# Patient Record
Sex: Male | Born: 1975 | Race: White | Hispanic: No | Marital: Single | Smoking: Current every day smoker
Health system: Southern US, Community
[De-identification: ages and names within clinical notes are randomized; demographics above are authoritative.]

## PROBLEM LIST (undated history)

## (undated) DIAGNOSIS — F32A Depression, unspecified: Secondary | ICD-10-CM

## (undated) DIAGNOSIS — I2510 Atherosclerotic heart disease of native coronary artery without angina pectoris: Secondary | ICD-10-CM

## (undated) DIAGNOSIS — F329 Major depressive disorder, single episode, unspecified: Secondary | ICD-10-CM

## (undated) DIAGNOSIS — R569 Unspecified convulsions: Secondary | ICD-10-CM

## (undated) DIAGNOSIS — F419 Anxiety disorder, unspecified: Secondary | ICD-10-CM

## (undated) HISTORY — PX: WISDOM TOOTH EXTRACTION: SHX21

---

## 1999-10-09 ENCOUNTER — Emergency Department (HOSPITAL_COMMUNITY): Admission: EM | Admit: 1999-10-09 | Discharge: 1999-10-09 | Payer: Self-pay | Admitting: Emergency Medicine

## 2001-05-14 ENCOUNTER — Encounter: Payer: Self-pay | Admitting: Family Medicine

## 2001-05-14 ENCOUNTER — Encounter: Admission: RE | Admit: 2001-05-14 | Discharge: 2001-05-14 | Payer: Self-pay | Admitting: Family Medicine

## 2001-08-01 ENCOUNTER — Emergency Department (HOSPITAL_COMMUNITY): Admission: EM | Admit: 2001-08-01 | Discharge: 2001-08-01 | Payer: Self-pay | Admitting: Emergency Medicine

## 2011-01-29 ENCOUNTER — Encounter: Payer: Self-pay | Admitting: Family Medicine

## 2011-01-29 ENCOUNTER — Ambulatory Visit (INDEPENDENT_AMBULATORY_CARE_PROVIDER_SITE_OTHER): Payer: BLUE CROSS/BLUE SHIELD | Admitting: Family Medicine

## 2011-01-29 DIAGNOSIS — Z720 Tobacco use: Secondary | ICD-10-CM | POA: Insufficient documentation

## 2011-01-29 DIAGNOSIS — Z8249 Family history of ischemic heart disease and other diseases of the circulatory system: Secondary | ICD-10-CM

## 2011-01-29 DIAGNOSIS — Z0000 Encounter for general adult medical examination without abnormal findings: Secondary | ICD-10-CM

## 2011-01-29 DIAGNOSIS — Z13220 Encounter for screening for lipoid disorders: Secondary | ICD-10-CM

## 2011-01-29 DIAGNOSIS — F17200 Nicotine dependence, unspecified, uncomplicated: Secondary | ICD-10-CM

## 2011-01-29 DIAGNOSIS — Z23 Encounter for immunization: Secondary | ICD-10-CM

## 2011-01-29 LAB — BASIC METABOLIC PANEL
BUN: 17 mg/dL (ref 6–23)
CO2: 30 mEq/L (ref 19–32)
Calcium: 9.3 mg/dL (ref 8.4–10.5)
Chloride: 103 mEq/L (ref 96–112)
Creatinine, Ser: 0.8 mg/dL (ref 0.4–1.5)
GFR: 113.19 mL/min (ref >60.00)
Glucose, Bld: 102 mg/dL — ABNORMAL HIGH (ref 70–99)
Potassium: 5.4 mEq/L — ABNORMAL HIGH (ref 3.5–5.1)
Sodium: 139 mEq/L (ref 135–145)

## 2011-01-29 LAB — CBC WITH DIFFERENTIAL/PLATELET
Basophils Absolute: 0.1 10*3/uL (ref 0.0–0.1)
Basophils Relative: 0.8 % (ref 0.0–3.0)
Eosinophils Absolute: 0.2 10*3/uL (ref 0.0–0.7)
Eosinophils Relative: 2.8 % (ref 0.0–5.0)
HCT: 38.4 % — ABNORMAL LOW (ref 39.0–52.0)
Hemoglobin: 13.4 g/dL (ref 13.0–17.0)
Lymphocytes Relative: 26.3 % (ref 12.0–46.0)
Lymphs Abs: 2 10*3/uL (ref 0.7–4.0)
MCHC: 34.9 g/dL (ref 30.0–36.0)
MCV: 98.8 fl (ref 78.0–100.0)
Monocytes Absolute: 0.5 10*3/uL (ref 0.1–1.0)
Monocytes Relative: 7.2 % (ref 3.0–12.0)
Neutro Abs: 4.8 10*3/uL (ref 1.4–7.7)
Neutrophils Relative %: 62.9 % (ref 43.0–77.0)
Platelets: 235 10*3/uL (ref 150.0–400.0)
RBC: 3.89 Mil/uL — ABNORMAL LOW (ref 4.22–5.81)
RDW: 13 % (ref 11.5–14.6)
WBC: 7.7 10*3/uL (ref 4.5–10.5)

## 2011-01-29 LAB — POCT URINALYSIS DIPSTICK
Bilirubin, UA: NEGATIVE
Blood, UA: NEGATIVE
Glucose, UA: NEGATIVE
Ketones, UA: NEGATIVE
Leukocytes, UA: NEGATIVE
Nitrite, UA: NEGATIVE
Protein, UA: NEGATIVE
Spec Grav, UA: 1.025
Urobilinogen, UA: 0.2
pH, UA: 6

## 2011-01-29 LAB — HEPATIC FUNCTION PANEL
ALT: 41 U/L (ref 0–53)
AST: 27 U/L (ref 0–37)
Albumin: 3.6 g/dL (ref 3.5–5.2)
Alkaline Phosphatase: 80 U/L (ref 39–117)
Bilirubin, Direct: 0.1 mg/dL (ref 0.0–0.3)
Total Bilirubin: 0.4 mg/dL (ref 0.3–1.2)
Total Protein: 6.5 g/dL (ref 6.0–8.3)

## 2011-01-29 LAB — LIPID PANEL
Cholesterol: 326 mg/dL — ABNORMAL HIGH (ref 0–200)
HDL: 65.2 mg/dL (ref >39.00)
Total CHOL/HDL Ratio: 5
Triglycerides: 226 mg/dL — ABNORMAL HIGH (ref 0.0–149.0)
VLDL: 45.2 mg/dL — ABNORMAL HIGH (ref 0.0–40.0)

## 2011-01-29 LAB — TSH: TSH: 1.2 u[IU]/mL (ref 0.35–5.50)

## 2011-01-29 LAB — HEMOGLOBIN A1C: Hgb A1c MFr Bld: 5.8 % (ref 4.6–6.5)

## 2011-01-29 MED ORDER — VARENICLINE TARTRATE 1 MG PO TABS: 1.0000 mg | ORAL_TABLET | Freq: Two times a day (BID) | ORAL | Status: AC

## 2011-01-29 NOTE — Patient Instructions (Signed)
Begin the chantix program by taking a half a tablet daily.  I will call you when I get your lab work back.  Obviously, no smoking.  Take an aspirin daily, and begin an exercise program by walking 30 minutes daily.  Follow-up in one year

## 2011-01-29 NOTE — Progress Notes (Signed)
  Subjective:    Patient ID: Andres Garza, male    DOB: 1976/09/10, 35 y.o.   MRN: [id number]  Andres Garza is a 35 year old single male, who comes in today after a 10 year absence to reestablish because he is concerned about his smoking and his father, who recently died of heart disease.  He smokes about 10 to 20 cigarettes per day.  His father had bypass surgery in his early 58s and in subsequent died this past fall at age 1, heart disease.  Other risk factors for hyperlipidemia, and smoking.  Mother in good health.  One brother in good health.  Sister recently diagnosed with hyper lipidemia.  Vaccination history unknown.  Will give tetanus booster today.    Review of Systems  Constitutional: Negative.   HENT: Negative.   Eyes: Negative.   Respiratory: Negative.   Cardiovascular: Negative.   Gastrointestinal: Negative.   Genitourinary: Negative.   Musculoskeletal: Negative.   Skin: Negative.   Neurological: Negative.   Hematological: Negative.   Psychiatric/Behavioral: Negative.        Objective:   Physical Exam  Constitutional: He is oriented to person, place, and time. He appears well-developed and well-nourished.  HENT:  Head: Normocephalic and atraumatic.  Right Ear: External ear normal.  Left Ear: External ear normal.  Nose: Nose normal.  Mouth/Throat: Oropharynx is clear and moist.  Eyes: Conjunctivae and EOM are normal. Pupils are equal, round, and reactive to light.  Neck: Normal range of motion. Neck supple. No JVD present. No tracheal deviation present. No thyromegaly present.  Cardiovascular: Normal rate, regular rhythm, normal heart sounds and intact distal pulses.  Exam reveals no gallop and no friction rub.   No murmur heard. Pulmonary/Chest: Effort normal and breath sounds normal. No stridor. No respiratory distress. He has no wheezes. He has no rales. He exhibits no tenderness.  Abdominal: Soft. Bowel sounds are normal. He exhibits no distension and no  mass. There is no tenderness. There is no rebound and no guarding.  Genitourinary: Penis normal. No penile tenderness.  Musculoskeletal: Normal range of motion. He exhibits no edema and no tenderness.  Lymphadenopathy:    He has no cervical adenopathy.  Neurological: He is alert and oriented to person, place, and time. He has normal reflexes. No cranial nerve deficit. He exhibits normal muscle tone.  Skin: Skin is warm and dry. No rash noted. No erythema. No pallor.  Psychiatric: He has a normal mood and affect. His behavior is normal. Judgment and thought content normal.          Assessment & Plan:  Healthy male.  History of tobacco abuse recommend starting the chantix program.  Family history premature coronary disease screen for diabetes and hyperlipidemia

## 2011-01-29 NOTE — Progress Notes (Signed)
Addended by: Kern Reap on: 01/29/2011 01:09 PM   Modules accepted: Orders

## 2011-01-30 DIAGNOSIS — Z0000 Encounter for general adult medical examination without abnormal findings: Secondary | ICD-10-CM | POA: Insufficient documentation

## 2011-01-30 MED ORDER — SIMVASTATIN 20 MG PO TABS: 20.0000 mg | ORAL_TABLET | Freq: Every day | ORAL | Status: DC

## 2011-01-30 NOTE — Progress Notes (Signed)
patient  Is aware and rx sent to pharmacy

## 2011-01-30 NOTE — Progress Notes (Signed)
Addended by: Kern Reap on: 01/30/2011 02:03 PM   Modules accepted: Orders

## 2017-06-17 ENCOUNTER — Encounter (INDEPENDENT_AMBULATORY_CARE_PROVIDER_SITE_OTHER): Payer: Self-pay | Admitting: Orthopaedic Surgery

## 2017-06-17 ENCOUNTER — Ambulatory Visit (INDEPENDENT_AMBULATORY_CARE_PROVIDER_SITE_OTHER): Payer: Self-pay | Admitting: Orthopaedic Surgery

## 2017-06-17 ENCOUNTER — Ambulatory Visit (INDEPENDENT_AMBULATORY_CARE_PROVIDER_SITE_OTHER): Payer: Self-pay

## 2017-06-17 VITALS — BP 146/88 | HR 107 | Ht 70.0 in | Wt 240.0 lb

## 2017-06-17 DIAGNOSIS — M79622 Pain in left upper arm: Secondary | ICD-10-CM

## 2017-06-17 NOTE — Progress Notes (Signed)
Office Visit Note   Patient: Andres Garza           Date of Birth: January 09, 1976           MRN: [id number] Visit Date: 06/17/2017              Requested by: No referring provider defined for this encounter. PCP: Roderick Pee, MD   Assessment & Plan: Visit Diagnoses:  1. Left upper arm pain     Probable left shoulder long head biceps tendon acute rupture. Plan: We'll obtain an MRI of his left shoulder to evaluating for acute long head biceps tendon rupture. He does lifting some moving activities while work and states he needs to have the good arm since this is his vocation. After MRI scan.  Follow-Up Instructions: No Follow-up on file.   Orders:  Orders Placed This Encounter  Procedures  . XR Humerus Left   No orders of the defined types were placed in this encounter.     Procedures: No procedures performed   Clinical Data: No additional findings.   Subjective: Chief Complaint  Patient presents with  . Left Upper Arm - Pain    HPI 41 year old male done here visiting he's here part of the time and also in [place]. He was moving a ottoman doing some pulling and suddenly felt sharp pain and pop in her left shoulder with the prominence and distal migration of the biceps. He states he had little problems with his opposite right shoulder in the past and had been doing a little bit more pulling with the left in the last few months. He denies any past injury to his left shoulder prior to this. He said the ecchymosis that occurred since his injury on 05/29/2017 with distal prominence of the biceps muscle and ecchymosis down toward the antecubital space on the left.  Review of Systems patient has positive smoking history is also has high cholesterol occasionally uses some ibuprofen for aches and pains. No previous surgeries or serious hospitalizations.   Objective: Vital Signs: BP (!) 146/88   Pulse (!) 107   Ht 5\' 10"  (1.778 m)   Wt 240 lb (108.9 kg)    BMI 34.44 kg/m   Physical Exam  Constitutional: He is oriented to person, place, and time. He appears well-developed and well-nourished.  HENT:  Head: Normocephalic and atraumatic.  Eyes: Pupils are equal, round, and reactive to light. EOM are normal.  Neck: No tracheal deviation present. No thyromegaly present.  Cardiovascular: Normal rate.   Pulmonary/Chest: Effort normal. He has no wheezes.  Abdominal: Soft. Bowel sounds are normal.  Musculoskeletal:  Patient is full cervical range of motion no brachioplexus tenderness. Palpation of the bicipital groove proximally no palpable tendon is noted. Deltoid sensation axillary nerve is intact. There is ecchymosis anteriorly over the arm that extends down the antecubital space. With some biceps resistance there is prominence of the distal bicep muscle which is asymmetrical with his opposite right arm. Asian hand is intact. Normal sensation good grip strength normal finger extension.   Neurological: He is alert and oriented to person, place, and time.  Skin: Skin is warm and dry. Capillary refill takes less than 2 seconds.  Psychiatric: He has a normal mood and affect. His behavior is normal. Judgment and thought content normal.    Ortho Exam  Specialty Comments:  No specialty comments available.  Imaging: Xr Humerus Left  Result Date: 06/17/2017 Two-view x-rays left humerus obtained and reviewed there is  no glenohumeral arthritis negative for acute fracture. Impression: Normal left humerus x-rays    PMFS History: Patient Active Problem List   Diagnosis Date Noted  . General medical examination 01/30/2011  . Tobacco abuse 01/29/2011  . Family history of premature coronary artery disease 01/29/2011   No past medical history on file.  Family History  Problem Relation Age of Onset  . Heart disease Father   . Hyperlipidemia Sister     No past surgical history on file. 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 Smoking status: Current Every Day Smoker    Packs/day: 1.00    Types: Cigarettes  . Smokeless tobacco: Never Used  . Alcohol use Yes  . Drug use: No  . Sexual activity: Not on file

## 2017-06-17 NOTE — Addendum Note (Signed)
Addended by: Rogers SeedsYEATTS, Caedon Bond M on: 06/17/2017 04:50 PM   Modules accepted: Orders

## 2017-06-22 DIAGNOSIS — M25511 Pain in right shoulder: Secondary | ICD-10-CM | POA: Diagnosis not present

## 2017-06-24 ENCOUNTER — Ambulatory Visit (INDEPENDENT_AMBULATORY_CARE_PROVIDER_SITE_OTHER): Payer: BLUE CROSS/BLUE SHIELD | Admitting: Orthopaedic Surgery

## 2017-06-24 ENCOUNTER — Encounter (INDEPENDENT_AMBULATORY_CARE_PROVIDER_SITE_OTHER): Payer: Self-pay | Admitting: Orthopaedic Surgery

## 2017-06-24 VITALS — BP 154/91 | HR 71 | Ht 70.0 in | Wt 240.0 lb

## 2017-06-24 DIAGNOSIS — S46212D Strain of muscle, fascia and tendon of other parts of biceps, left arm, subsequent encounter: Secondary | ICD-10-CM

## 2017-07-01 ENCOUNTER — Other Ambulatory Visit (INDEPENDENT_AMBULATORY_CARE_PROVIDER_SITE_OTHER): Payer: Self-pay | Admitting: Family

## 2017-07-01 ENCOUNTER — Encounter (HOSPITAL_COMMUNITY): Payer: Self-pay | Admitting: *Deleted

## 2017-07-02 ENCOUNTER — Encounter (HOSPITAL_COMMUNITY)
Admission: RE | Disposition: A | Discharge status: Home / Self Care | Payer: Self-pay | Source: Ambulatory Visit | Attending: Orthopaedic Surgery

## 2017-07-02 ENCOUNTER — Encounter (HOSPITAL_COMMUNITY): Payer: Self-pay

## 2017-07-02 ENCOUNTER — Ambulatory Visit (HOSPITAL_COMMUNITY): Payer: BLUE CROSS/BLUE SHIELD | Admitting: Certified Registered"

## 2017-07-02 ENCOUNTER — Ambulatory Visit (HOSPITAL_COMMUNITY)
Admission: RE | Admit: 2017-07-02 | Discharge: 2017-07-02 | Disposition: A | Discharge status: Home / Self Care | Payer: BLUE CROSS/BLUE SHIELD | Source: Ambulatory Visit | Attending: Orthopaedic Surgery | Admitting: Orthopaedic Surgery

## 2017-07-02 DIAGNOSIS — S46112A Strain of muscle, fascia and tendon of long head of biceps, left arm, initial encounter: Secondary | ICD-10-CM | POA: Insufficient documentation

## 2017-07-02 DIAGNOSIS — X500XXA Overexertion from strenuous movement or load, initial encounter: Secondary | ICD-10-CM | POA: Diagnosis not present

## 2017-07-02 DIAGNOSIS — F17210 Nicotine dependence, cigarettes, uncomplicated: Secondary | ICD-10-CM | POA: Insufficient documentation

## 2017-07-02 DIAGNOSIS — S46212A Strain of muscle, fascia and tendon of other parts of biceps, left arm, initial encounter: Secondary | ICD-10-CM | POA: Diagnosis not present

## 2017-07-02 DIAGNOSIS — G8918 Other acute postprocedural pain: Secondary | ICD-10-CM | POA: Diagnosis not present

## 2017-07-02 DIAGNOSIS — S46119A Strain of muscle, fascia and tendon of long head of biceps, unspecified arm, initial encounter: Secondary | ICD-10-CM

## 2017-07-02 DIAGNOSIS — Z8249 Family history of ischemic heart disease and other diseases of the circulatory system: Secondary | ICD-10-CM | POA: Diagnosis not present

## 2017-07-02 DIAGNOSIS — F418 Other specified anxiety disorders: Secondary | ICD-10-CM | POA: Diagnosis not present

## 2017-07-02 DIAGNOSIS — Z8349 Family history of other endocrine, nutritional and metabolic diseases: Secondary | ICD-10-CM | POA: Insufficient documentation

## 2017-07-02 HISTORY — DX: Major depressive disorder, single episode, unspecified: F32.9

## 2017-07-02 HISTORY — DX: Depression, unspecified: F32.A

## 2017-07-02 HISTORY — DX: Anxiety disorder, unspecified: F41.9

## 2017-07-02 HISTORY — PX: BICEPT TENODESIS: SHX5116

## 2017-07-02 LAB — CBC
HEMATOCRIT: 44.5 % (ref 39.0–52.0)
HEMOGLOBIN: 15.4 g/dL (ref 13.0–17.0)
MCH: 32.6 pg (ref 26.0–34.0)
MCHC: 34.6 g/dL (ref 30.0–36.0)
MCV: 94.3 fL (ref 78.0–100.0)
Platelets: 308 10*3/uL (ref 150–400)
RBC: 4.72 MIL/uL (ref 4.22–5.81)
RDW: 12.8 % (ref 11.5–15.5)
WBC: 10.3 10*3/uL (ref 4.0–10.5)

## 2017-07-02 LAB — COMPREHENSIVE METABOLIC PANEL
ALBUMIN: 4.1 g/dL (ref 3.5–5.0)
ALK PHOS: 65 U/L (ref 38–126)
ALT: 46 U/L (ref 17–63)
ANION GAP: 12 (ref 5–15)
AST: 33 U/L (ref 15–41)
BUN: 7 mg/dL (ref 6–20)
CO2: 19 mmol/L — AB (ref 22–32)
Calcium: 9.5 mg/dL (ref 8.9–10.3)
Chloride: 103 mmol/L (ref 101–111)
Creatinine, Ser: 0.75 mg/dL (ref 0.61–1.24)
GFR calc Af Amer: >60 mL/min (ref >60)
GFR calc non Af Amer: >60 mL/min (ref >60)
GLUCOSE: 112 mg/dL — AB (ref 65–99)
Potassium: 4.2 mmol/L (ref 3.5–5.1)
SODIUM: 134 mmol/L — AB (ref 135–145)
Total Bilirubin: 0.8 mg/dL (ref 0.3–1.2)
Total Protein: 7.1 g/dL (ref 6.5–8.1)

## 2017-07-02 SURGERY — TENODESIS, BICEPS
Anesthesia: General | Laterality: Left

## 2017-07-02 MED ORDER — BUPIVACAINE-EPINEPHRINE (PF) 0.5% -1:200000 IJ SOLN
INTRAMUSCULAR | Status: DC | PRN
  Administered 2017-07-02: 30 mL via PERINEURAL

## 2017-07-02 MED ORDER — OXYCODONE-ACETAMINOPHEN 5-325 MG PO TABS: 1.0000 | ORAL_TABLET | Freq: Four times a day (QID) | ORAL | 0 refills | Status: DC | PRN

## 2017-07-02 MED ORDER — FENTANYL CITRATE (PF) 250 MCG/5ML IJ SOLN
INTRAMUSCULAR | Status: AC
  Filled 2017-07-02: qty 5

## 2017-07-02 MED ORDER — ONDANSETRON HCL 4 MG/2ML IJ SOLN
INTRAMUSCULAR | Status: DC | PRN
  Administered 2017-07-02: 4 mg via INTRAVENOUS

## 2017-07-02 MED ORDER — PROPOFOL 10 MG/ML IV BOLUS
INTRAVENOUS | Status: DC | PRN
  Administered 2017-07-02 (×2): 200 mg via INTRAVENOUS

## 2017-07-02 MED ORDER — MIDAZOLAM HCL 2 MG/2ML IJ SOLN
2.0000 mg | Freq: Once | INTRAMUSCULAR | Status: AC
  Administered 2017-07-02: 2 mg via INTRAVENOUS
  Filled 2017-07-02: qty 2

## 2017-07-02 MED ORDER — DEXAMETHASONE SODIUM PHOSPHATE 10 MG/ML IJ SOLN
INTRAMUSCULAR | Status: DC | PRN
  Administered 2017-07-02: 5 mg via INTRAVENOUS

## 2017-07-02 MED ORDER — LIDOCAINE 2% (20 MG/ML) 5 ML SYRINGE
INTRAMUSCULAR | Status: AC
  Filled 2017-07-02: qty 5

## 2017-07-02 MED ORDER — DEXAMETHASONE SODIUM PHOSPHATE 10 MG/ML IJ SOLN
INTRAMUSCULAR | Status: AC
  Filled 2017-07-02: qty 1

## 2017-07-02 MED ORDER — FENTANYL CITRATE (PF) 100 MCG/2ML IJ SOLN
INTRAMUSCULAR | Status: DC | PRN
  Administered 2017-07-02: 100 ug via INTRAVENOUS

## 2017-07-02 MED ORDER — LACTATED RINGERS IV SOLN
INTRAVENOUS | Status: DC
  Administered 2017-07-02: 11:00:00 via INTRAVENOUS

## 2017-07-02 MED ORDER — CEFAZOLIN SODIUM-DEXTROSE 2-4 GM/100ML-% IV SOLN
2.0000 g | INTRAVENOUS | Status: AC
  Administered 2017-07-02: 2 g via INTRAVENOUS
  Filled 2017-07-02: qty 100

## 2017-07-02 MED ORDER — CHLORHEXIDINE GLUCONATE 4 % EX LIQD: 60.0000 mL | Freq: Once | CUTANEOUS | Status: DC

## 2017-07-02 MED ORDER — PROPOFOL 10 MG/ML IV BOLUS
INTRAVENOUS | Status: AC
  Filled 2017-07-02: qty 20

## 2017-07-02 MED ORDER — 0.9 % SODIUM CHLORIDE (POUR BTL) OPTIME
TOPICAL | Status: DC | PRN
  Administered 2017-07-02: 1000 mL

## 2017-07-02 MED ORDER — HYDROMORPHONE HCL 1 MG/ML IJ SOLN
0.2500 mg | INTRAMUSCULAR | Status: DC | PRN
  Administered 2017-07-02: 0.5 mg via INTRAVENOUS

## 2017-07-02 MED ORDER — BUPIVACAINE-EPINEPHRINE (PF) 0.25% -1:200000 IJ SOLN
INTRAMUSCULAR | Status: AC
  Filled 2017-07-02: qty 30

## 2017-07-02 MED ORDER — LIDOCAINE 2% (20 MG/ML) 5 ML SYRINGE
INTRAMUSCULAR | Status: DC | PRN
  Administered 2017-07-02: 40 mg via INTRAVENOUS

## 2017-07-02 MED ORDER — FENTANYL CITRATE (PF) 100 MCG/2ML IJ SOLN
100.0000 ug | Freq: Once | INTRAMUSCULAR | Status: AC
  Administered 2017-07-02: 100 ug via INTRAVENOUS
  Filled 2017-07-02: qty 2

## 2017-07-02 MED ORDER — HYDROMORPHONE HCL 1 MG/ML IJ SOLN
INTRAMUSCULAR | Status: AC
  Filled 2017-07-02: qty 1

## 2017-07-02 SURGICAL SUPPLY — 39 items
APL SKNCLS STERI-STRIP NONHPOA (GAUZE/BANDAGES/DRESSINGS) ×1
BENZOIN TINCTURE PRP APPL 2/3 (GAUZE/BANDAGES/DRESSINGS) ×2 IMPLANT
BNDG COHESIVE 6X5 TAN STRL LF (GAUZE/BANDAGES/DRESSINGS) ×2 IMPLANT
CLOSURE WOUND 1/2 X4 (GAUZE/BANDAGES/DRESSINGS) ×1
COVER SURGICAL LIGHT HANDLE (MISCELLANEOUS) ×3 IMPLANT
DRAPE INCISE IOBAN 66X45 STRL (DRAPES) ×2 IMPLANT
DRAPE U-SHAPE 47X51 STRL (DRAPES) ×3 IMPLANT
ELECT CAUTERY BLADE 6.4 (BLADE) ×2 IMPLANT
ELECT REM PT RETURN 9FT ADLT (ELECTROSURGICAL) ×3
ELECTRODE REM PT RTRN 9FT ADLT (ELECTROSURGICAL) ×1 IMPLANT
GAUZE SPONGE 4X4 12PLY STRL (GAUZE/BANDAGES/DRESSINGS) ×2 IMPLANT
GAUZE XEROFORM 1X8 LF (GAUZE/BANDAGES/DRESSINGS) ×2 IMPLANT
GLOVE BIOGEL PI IND STRL 8 (GLOVE) ×1 IMPLANT
GLOVE BIOGEL PI INDICATOR 8 (GLOVE) ×2
GLOVE ORTHO TXT STRL SZ7.5 (GLOVE) ×3 IMPLANT
GLOVE SURG SS PI 6.0 STRL IVOR (GLOVE) ×2 IMPLANT
GOWN STRL REUS W/ TWL LRG LVL3 (GOWN DISPOSABLE) ×1 IMPLANT
GOWN STRL REUS W/ TWL XL LVL3 (GOWN DISPOSABLE) ×1 IMPLANT
GOWN STRL REUS W/TWL LRG LVL3 (GOWN DISPOSABLE) ×3
GOWN STRL REUS W/TWL XL LVL3 (GOWN DISPOSABLE) ×3
KIT BASIN OR (CUSTOM PROCEDURE TRAY) ×3 IMPLANT
KIT BIO-TENODESIS 3X8 DISP (MISCELLANEOUS) ×3
KIT INSRT BABSR STRL DISP BTN (MISCELLANEOUS) IMPLANT
KIT ROOM TURNOVER OR (KITS) ×3 IMPLANT
NS IRRIG 1000ML POUR BTL (IV SOLUTION) ×3 IMPLANT
PACK ORTHO EXTREMITY (CUSTOM PROCEDURE TRAY) ×3 IMPLANT
PAD ARMBOARD 7.5X6 YLW CONV (MISCELLANEOUS) ×6 IMPLANT
STOCKINETTE IMPERVIOUS 9X36 MD (GAUZE/BANDAGES/DRESSINGS) ×2 IMPLANT
STRIP CLOSURE SKIN 1/2X4 (GAUZE/BANDAGES/DRESSINGS) ×1 IMPLANT
SUT VIC AB 2-0 CT1 27 (SUTURE) ×3
SUT VIC AB 2-0 CT1 TAPERPNT 27 (SUTURE) IMPLANT
SUT VIC AB 3-0 FS2 27 (SUTURE) ×3 IMPLANT
SUT VIC AB 4-0 PS2 27 (SUTURE) ×2 IMPLANT
TAPE CLOTH SURG 4X10 WHT LF (GAUZE/BANDAGES/DRESSINGS) ×2 IMPLANT
TOWEL OR 17X24 6PK STRL BLUE (TOWEL DISPOSABLE) ×6 IMPLANT
TUBE CONNECTING 12'X1/4 (SUCTIONS) ×1
TUBE CONNECTING 12X1/4 (SUCTIONS) ×2 IMPLANT
UNDERPAD 30X30 (UNDERPADS AND DIAPERS) ×3 IMPLANT
YANKAUER SUCT BULB TIP NO VENT (SUCTIONS) ×3 IMPLANT

## 2017-07-02 NOTE — Anesthesia Preprocedure Evaluation (Addendum)
Anesthesia Evaluation  Patient identified by MRN, date of birth, ID band Patient awake    Reviewed: Allergy & Precautions, H&P , NPO status , Patient's Chart, lab work & pertinent test results  Airway Mallampati: III  TM Distance: >3 FB Neck ROM: Full    Dental no notable dental hx. (+) Teeth Intact, Dental Advisory Given   Pulmonary Current Smoker,    Pulmonary exam normal breath sounds clear to auscultation       Cardiovascular negative cardio ROS   Rhythm:Regular Rate:Normal     Neuro/Psych Anxiety Depression negative neurological ROS     GI/Hepatic negative GI ROS, Neg liver ROS,   Endo/Other  negative endocrine ROS  Renal/GU negative Renal ROS  negative genitourinary   Musculoskeletal   Abdominal   Peds  Hematology negative hematology ROS (+)   Anesthesia Other Findings   Reproductive/Obstetrics negative OB ROS                            Anesthesia Physical Anesthesia Plan  ASA: II  Anesthesia Plan: General   Post-op Pain Management:  Regional for Post-op pain   Induction: Intravenous  PONV Risk Score and Plan: 1 and Ondansetron, Dexamethasone and Midazolam  Airway Management Planned: Oral ETT and LMA  Additional Equipment: None  Intra-op Plan:   Post-operative Plan: Extubation in OR  Informed Consent: I have reviewed the patients History and Physical, chart, labs and discussed the procedure including the risks, benefits and alternatives for the proposed anesthesia with the patient or authorized representative who has indicated his/her understanding and acceptance.   Dental advisory given  Plan Discussed with: CRNA, Anesthesiologist and Surgeon  Anesthesia Plan Comments:       Anesthesia Quick Evaluation

## 2017-07-02 NOTE — Anesthesia Procedure Notes (Signed)
Procedure Name: LMA Insertion Date/Time: 07/02/2017 1:09 PM Performed by: Charm Barges, Naoma Boxell R Pre-anesthesia Checklist: Patient identified, Emergency Drugs available, Suction available and Patient being monitored Patient Re-evaluated:Patient Re-evaluated prior to induction Oxygen Delivery Method: Circle System Utilized Preoxygenation: Pre-oxygenation with 100% oxygen Induction Type: IV induction Ventilation: Mask ventilation without difficulty LMA: LMA inserted LMA Size: 5.0 Number of attempts: 1 Placement Confirmation: positive ETCO2 Tube secured with: Tape Dental Injury: Teeth and Oropharynx as per pre-operative assessment

## 2017-07-02 NOTE — Brief Op Note (Signed)
07/02/2017  2:43 PM  PATIENT:  Jehad A Agnes  41 y.o. 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   SURGEON:  Surgeon(s) and Role:    * Eldred Manges, MD - Primary  PHYSICIAN ASSISTANT:   ASSISTANTS: none   ANESTHESIA:   general  EBL:  Total I/O In: 500 [I.V.:500] Out: 30 [Blood:30]  BLOOD ADMINISTERED:none  DRAINS: none   LOCAL MEDICATIONS USED:  NONE  SPECIMEN:  No Specimen  DISPOSITION OF SPECIMEN:  N/A  COUNTS:  YES  TOURNIQUET:  * No tourniquets in log *  DICTATION: .Other Dictation: Dictation Number [id number]  PLAN OF CARE: Discharge to home after PACU  PATIENT DISPOSITION:  PACU - hemodynamically stable.   Delay start of Pharmacological VTE agent (>24hrs) due to surgical blood loss or risk of bleeding: not applicable

## 2017-07-02 NOTE — Anesthesia Postprocedure Evaluation (Signed)
Anesthesia Post Note  Patient: Andres Garza  Procedure(s) Performed: Procedure(s) (LRB): Left Arm Biceps Tenodesis (Left)     Patient location during evaluation: PACU Anesthesia Type: General and Regional Level of consciousness: awake and alert Pain management: pain level controlled Vital Signs Assessment: post-procedure vital signs reviewed and stable Respiratory status: spontaneous breathing, nonlabored ventilation and respiratory function stable Cardiovascular status: blood pressure returned to baseline and stable Postop Assessment: no apparent nausea or vomiting Anesthetic complications: no    Last Vitals:  Vitals:   07/02/17 1515 07/02/17 1530  BP:  (!) 135/92  Pulse: 82 74  Resp: 18 15  Temp:  36.6 C  SpO2: 96% 95%    Last Pain:  Vitals:   07/02/17 1457  TempSrc:   PainSc: 6                  Afia Messenger,W. EDMOND

## 2017-07-02 NOTE — Transfer of Care (Signed)
Immediate Anesthesia Transfer of Care Note  Patient: Estevon A Leard  Procedure(s) Performed: Procedure(s): Left Arm Biceps Tenodesis (Left)  Patient Location: PACU  Anesthesia Type:GA combined with regional for post-op pain  Level of Consciousness: awake, oriented and patient cooperative  Airway & Oxygen Therapy: Patient Spontanous Breathing and Patient connected to nasal cannula oxygen  Post-op Assessment: Report given to RN, Post -op Vital signs reviewed and stable and Patient moving all extremities  Post vital signs: Reviewed and stable  Last Vitals:  Vitals:   07/02/17 1026  BP: (!) 145/97  Pulse: 77  Resp: 18  Temp: 36.8 C  SpO2: 98%    Last Pain:  Vitals:   07/02/17 1026  TempSrc: Oral      Patients Stated Pain Goal: 5 (07/02/17 1030)  Complications: No apparent anesthesia complications

## 2017-07-02 NOTE — Progress Notes (Signed)
Orthopedic Tech Progress Note Patient Details:  Andres Garza 04-03-1976 [id number]  Ortho Devices Type of Ortho Device: Arm sling Ortho Device/Splint Location: lue Ortho Device/Splint Interventions: Application   Lori Popowski 07/02/2017, 3:10 PM

## 2017-07-02 NOTE — Anesthesia Procedure Notes (Signed)
Anesthesia Regional Block: Interscalene brachial plexus block   Pre-Anesthetic Checklist: ,, timeout performed, Correct Patient, Correct Site, Correct Laterality, Correct Procedure, Correct Position, site marked, Risks and benefits discussed, pre-op evaluation,  At surgeon's request and post-op pain management  Laterality: Left  Prep: Maximum Sterile Barrier Precautions used, chloraprep       Needles:  Injection technique: Single-shot  Needle Type: Echogenic Stimulator Needle     Needle Length: 5cm  Needle Gauge: 22     Additional Needles:   Procedures:, nerve stimulator,,, ultrasound used (permanent image in chart),,,,   Nerve Stimulator or Paresthesia:  Response: Biceps response,   Additional Responses:   Narrative:  Start time: 07/02/2017 11:07 AM End time: 07/02/2017 11:17 AM Injection made incrementally with aspirations every 5 mL. Anesthesiologist: Gaynelle Adu  Additional Notes: 2% Lidocaine skin wheel.

## 2017-07-02 NOTE — Discharge Instructions (Signed)
Ok to shower, sling PRN. Remove dressing at 48hrs and OK to apply large band aids.  See Dr. Ophelia Charter in one week

## 2017-07-02 NOTE — H&P (Signed)
Andres Garza is an 41 y.o. male.   Chief Complaint: left biceps long head rupture HPI: moving ottoman with sharp left shoulder pain and pop. MRI biceps tendon rupture at shoulder.  Past Medical History:  Diagnosis Date  . Anxiety   . Depression    situational    Past Surgical History:  Procedure Laterality Date  . WISDOM TOOTH EXTRACTION  1990s   Family History  Problem Relation Age of Onset  . Heart disease Father   . Hyperlipidemia Sister    Social History:  reports that he has been smoking Cigarettes.  He has a 10.00 pack-year smoking history. He has never used smokeless tobacco. He reports that he drinks about 21.6 oz of alcohol per week . He reports that he uses drugs, including Marijuana.  Allergies: No Known Allergies  Medications Prior to Admission  Medication Sig Dispense Refill  . ibuprofen (ADVIL,MOTRIN) 200 MG tablet Take 600-800 mg by mouth every 8 (eight) hours as needed (for pain/inflammation.).      Results for orders placed or performed during the hospital encounter of 07/02/17 (from the past 48 hour(s))  Comprehensive metabolic panel     Status: Abnormal   Collection Time: 07/02/17 10:22 AM  Result Value Ref Range   Sodium 134 (L) 135 - 145 mmol/L   Potassium 4.2 3.5 - 5.1 mmol/L   Chloride 103 101 - 111 mmol/L   CO2 19 (L) 22 - 32 mmol/L   Glucose, Bld 112 (H) 65 - 99 mg/dL   BUN 7 6 - 20 mg/dL   Creatinine, Ser 0.75 0.61 - 1.24 mg/dL   Calcium 9.5 8.9 - 10.3 mg/dL   Total Protein 7.1 6.5 - 8.1 g/dL   Albumin 4.1 3.5 - 5.0 g/dL   AST 33 15 - 41 U/L   ALT 46 17 - 63 U/L   Alkaline Phosphatase 65 38 - 126 U/L   Total Bilirubin 0.8 0.3 - 1.2 mg/dL   GFR calc non Af Amer >60 >60 mL/min   GFR calc Af Amer >60 >60 mL/min    Comment: (NOTE) The eGFR has been calculated using the CKD EPI equation. This calculation has not been validated in all clinical situations. eGFR's persistently <60 mL/min signify possible Chronic Kidney Disease.    Anion gap  12 5 - 15  CBC     Status: None   Collection Time: 07/02/17 10:22 AM  Result Value Ref Range   WBC 10.3 4.0 - 10.5 K/uL   RBC 4.72 4.22 - 5.81 MIL/uL   Hemoglobin 15.4 13.0 - 17.0 g/dL   HCT 44.5 39.0 - 52.0 %   MCV 94.3 78.0 - 100.0 fL   MCH 32.6 26.0 - 34.0 pg   MCHC 34.6 30.0 - 36.0 g/dL   RDW 12.8 11.5 - 15.5 %   Platelets 308 150 - 400 K/uL   No results found.  Review of Systems  Constitutional: Negative.   HENT: Negative.   Eyes: Negative.   Cardiovascular: Negative.   Gastrointestinal: Negative.   Genitourinary: Negative.   Musculoskeletal:       Left shoulder pain  Skin: Negative.   Endo/Heme/Allergies: Negative.   Psychiatric/Behavioral: Negative.     Blood pressure (!) 145/97, pulse 77, temperature 98.3 F (36.8 C), temperature source Oral, resp. rate 18, height '5\' 10"'$  (1.778 m), weight 240 lb (108.9 kg), SpO2 98 %. Physical Exam  Constitutional: He is oriented to person, place, and time. He appears well-developed and well-nourished.  HENT:  Head:  Normocephalic.  Eyes: Pupils are equal, round, and reactive to light.  Neck: Normal range of motion.  Cardiovascular: Normal rate.   Respiratory: Effort normal.  GI: Soft. Bowel sounds are normal.  Musculoskeletal:  Left biceps muscle distal migration. Tender bicipital groove  Neurological: He is alert and oriented to person, place, and time.  Skin: Skin is warm and dry.  Psychiatric: He has a normal mood and affect. His behavior is normal.     Assessment/Plan Plan biceps tenodesis for long head shoulder rupture. Risks discussed he understands and agrees to proceed.   Marybelle Killings, MD 07/02/2017, 12:54 PM

## 2017-07-02 NOTE — Interval H&P Note (Signed)
History and Physical Interval Note:  07/02/2017 12:59 PM  Andres Garza  has presented today for surgery, with the diagnosis of Left Shoulder Acute Long Head Biceps Rupture  The various methods of treatment have been discussed with the patient and family. After consideration of risks, benefits and other options for treatment, the patient has consented to  Procedure(s): Left Arm Biceps Tenodesis (Left) as a surgical intervention .  The patient's history has been reviewed, patient examined, no change in status, stable for surgery.  I have reviewed the patient's chart and labs.  Questions were answered to the patient's satisfaction.     Eldred Manges

## 2017-07-03 ENCOUNTER — Encounter (HOSPITAL_COMMUNITY): Payer: Self-pay | Admitting: Orthopaedic Surgery

## 2017-07-03 NOTE — Op Note (Addendum)
There was a delay in dictating this note due to dragon dictation going down from an update on [employer].  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  Procedure: exploration for repair of long head biceps tendon. ( Operative findings showed that this tear was old from many years ago and not able to be repaired.) resection of portion of long head chronic biceps rupture.   Surgeon: Annell Greening M.D.  Anesthesia: Gen.  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 MRI scan was done of his shoulder preoperatively that showed long head of the biceps tendon was absent in the bicipital groove and there was no intra-articular pathology. He was brought to surgery for expected acute repair of long head biceps tendon tear.  Procedure after standard prepping and draping preoperative antibiotics timeout procedure with extremity sheets and drapes impervious stockinette prepping of the shoulder all the way down to the wrist. DuraPrep was used Betadine Steri-Drape was applied. Incision was made starting at the bicipital groove extending down 4 cm. Deltoid was split down the bicipital groove and the bicipital groove was visualized with absent long head biceps tendon. Attempts to milk tendon from distal proximal was performed unsuccessfully. Extended incision over the biceps muscle halfway down to the elbow was performed and extensive scarring was seen with absent long head of the biceps tendon which should rolled into a mass of scar tissue and was not able to be dissected out of the massive scar tissue so  it could be extended. Portion was cut longitudinally and opened up. There was scar tissue mixed with some tendon fibers, a portion was resected  the  of the long tendon of the biceps but there was not fibers that could be separated to extend proximally to even a subpectoralis position for attempted tenodesis.  Short head of the biceps was visualized and showed some mild tendinopathy changes. Extensive work was done on the ball of tissue hoping to be able to uncoil  the biceps tendon to stretch it up to place it in a subpectoralis position for tenodesis  but there was no stump of tendon that could be extended from the ball of scar tissue . Since patient had been doing heavy lifting for several years without problems until 05/29/2017 it appeared that he had strained the biceps muscle at the time of his injury on 05/29/2017 and in the interim from his original injury likely several years ago he been able to continue working and lifting. He had not been permitted for grafting and since in the last 3 years has been able to do heavy lifting without problems no tendon graft was performed. Wound was copiously irrigated. Split the deltoid was loosely approximated with 2-0 Vicryl which fell back and evenly into place. Subtendinous tissue was reapproximated with 2-0 Vicryl 3-0 Vicryl and more superficial layer and a running 4-0 Vicryl subcuticular closure tincture benzoin Steri-Strips postop dressing and placement of the sling was performed. Patient tolerated procedure well and operative findings was discussed with the patient as well as his family in recovery room. Patient tolerated the procedure well and was transferred to  the recovery room in stable condition.

## 2017-07-04 ENCOUNTER — Encounter: Payer: Self-pay | Admitting: Family Medicine

## 2017-07-07 NOTE — Progress Notes (Signed)
Office Visit Note   Patient: Andres Garza           Date of Birth: 1976/03/22           MRN: [id number] Visit Date: 06/24/2017              Requested by: Roderick Pee, MD [address] [place], [place] [address] PCP: Roderick Pee, MD   Assessment & Plan: Visit Diagnoses:  1. Rupture of left proximal biceps tendon, subsequent encounter     Plan: Patient like to proceed with long head biceps tendon T no decelerations we discussed options for tenodesis versus no repair. We discussed it is possible that he may have had some partial tearing were longer to time in the pop may have been just a tiny residual stump. He experienced acute pain and significant ecchymosis at the time of his injury. It's now been almost a month since his injury. Plan exploration and tenodesis.Marland Kitchen MRI scan of the shoulder did not reveal any intra-articular pathology and I do not think shoulder arthroscopy needs to be included. Procedure discussed questions were elicited and answered he understands request to proceed. We discussed that the tear is old and it may not be able to be tenodesed.  Follow-Up Instructions: No Follow-up on file.   Orders:  No orders of the defined types were placed in this encounter.  No orders of the defined types were placed in this encounter.     Procedures: No procedures performed   Clinical Data: No additional findings.   Subjective: Chief Complaint  Patient presents with  . Left Shoulder - Follow-up    HPI 41 year old male returns for acute left biceps injury that occurred on 05/29/2017 when he was lifting an ottoman which was heavy bowling and felt sharp pop with pain drop the ottoman and had ecchymosis with prominence of the distal biceps and the arm which is a new finding for him. He's had an MRI scan of the shoulder for review. 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  Patient continues to have pain if he tries to pull his been resting the arm. MRI scan has been completed.  Review of Systems review of systems is updated unchanged from 06/17/2017 office visit. He denies any previous prominence of the distal biceps in the left arm until the 05/29/2017 injury. Had no pain with pulling prior to this.   Objective: Vital Signs: BP (!) 154/91   Pulse 71   Ht  (1.778 m)   Wt 240 lb (108.9 kg)   BMI 34.44 kg/m   Physical Exam  Constitutional: He is oriented to person, place, and time. He appears well-developed and well-nourished.  HENT:  Head: Normocephalic and atraumatic.  Eyes: Pupils are equal, round, and reactive to light. EOM are normal.  Neck: No tracheal deviation present. No thyromegaly present.  Cardiovascular: Normal rate.   Pulmonary/Chest: Effort normal. He has no wheezes.  Abdominal: Soft. Bowel sounds are normal.  Neurological: He is alert and oriented to person, place, and time.  Skin: Skin is warm and dry. Capillary refill takes less than 2 seconds.  Psychiatric: He has a normal mood and affect. His behavior is normal. Judgment and thought content normal.    Ortho Exam patient said some decrease on since last week the distal arm ecchymosis as changed in color in the distal arm and barely visible. He still is prominence  of the distal biceps and bicipital groove palpation does not reveal palpable tendon. He does have tenderness in this region. Sensation the hand is intact. Good cervical range of motion no brachial plexus tenderness. No evidence of peripheral nerve entrapment left right upper extremity. Opposite right shoulder shows palpable biceps tendon in the bicipital groove. No muscle atrophy. Distal biceps tendon is palpable. Prominence of the biceps muscle in the distal arm and pain with resisted elbow flexion.  Specialty Comments:  No specialty comments available.  Imaging: 06/22/2017 MRI scan left shoulder is reviewed. This shows an  head of the biceps tendon is not visualized in the bicipital groove with a small stump the biceps tendon attached to the superior labrum. Rotator cuff was intact no glenohumeral arthritis. No labral tears.   PMFS History: Patient Active Problem List   Diagnosis Date Noted  . Biceps rupture, proximal 07/02/2017  . General medical examination 01/30/2011  . Tobacco abuse 01/29/2011  . Family history of premature coronary artery disease 01/29/2011   Past Medical History:  Diagnosis Date  . Anxiety   . Depression    situational    Family History  Problem Relation Age of Onset  . Heart disease Father   . Hyperlipidemia Sister     Past Surgical History:  Procedure Laterality Date  . BICEPT TENODESIS Left 07/02/2017   Procedure: Left Arm Biceps Tenodesis;  Surgeon: Eldred Manges, MD;  Location: [facility] OR;  Service: Orthopedics;  Laterality: Left;  . 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 Smoking status: Current Every Day Smoker    Packs/day: 1.00    Years: 10.00    Types: Cigarettes  . Smokeless tobacco: Never Used     Comment: stop and started "probably 10 years total over 20 years"   . Alcohol use 21.6 oz/week    36 Cans of beer per week  . Drug use: Yes    Types: Marijuana  . Sexual activity: Not on file

## 2017-07-09 ENCOUNTER — Ambulatory Visit (INDEPENDENT_AMBULATORY_CARE_PROVIDER_SITE_OTHER): Payer: BLUE CROSS/BLUE SHIELD | Admitting: Surgery

## 2017-07-09 ENCOUNTER — Encounter (INDEPENDENT_AMBULATORY_CARE_PROVIDER_SITE_OTHER): Payer: Self-pay | Admitting: Surgery

## 2017-07-09 ENCOUNTER — Ambulatory Visit (INDEPENDENT_AMBULATORY_CARE_PROVIDER_SITE_OTHER): Payer: Self-pay | Admitting: Orthopaedic Surgery

## 2017-07-09 DIAGNOSIS — S46212D Strain of muscle, fascia and tendon of other parts of biceps, left arm, subsequent encounter: Secondary | ICD-10-CM

## 2017-07-09 NOTE — Progress Notes (Signed)
   Post-Op Visit Note   Patient: Andres Garza           Date of Birth: [birth year]           MRN: [id number] Visit Date: 07/09/2017 PCP: Roderick Pee, MD   Assessment & Plan:  Chief Complaint: No chief complaint on file. Patient returns. 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 Doing well at this point. He is hoping to return back to [place] where he lives as soon as possible. Visit Diagnoses:  1. Rupture of left proximal biceps tendon, subsequent encounter     Plan: Patient doing well. He is planning on returning back to [place] his symptoms he can. Recommend that he come back in 1 week for final wound check. All questions answered. No heavy lifting. He can work range of motion of his elbow. Follow-Up Instructions: Return in about 1 week (around 07/16/2017) for Wound check.   Orders:  No orders of the defined types were placed in this encounter.  No orders of the defined types were placed in this encounter.   Imaging: No results found.  PMFS History: Patient Active Problem List   Diagnosis Date Noted  . Biceps rupture, proximal 07/02/2017  . General medical examination 01/30/2011  . Tobacco abuse 01/29/2011  . Family history of premature coronary artery disease 01/29/2011   Past Medical History:  Diagnosis Date  . Anxiety   . Depression    situational    Family History  Problem Relation Age of Onset  . Heart disease Father   . Hyperlipidemia Sister     Past Surgical History:  Procedure Laterality Date  . BICEPT TENODESIS Left 07/02/2017   Procedure: Left Arm Biceps Tenodesis;  Surgeon: Eldred Manges, MD;  Location: [facility] OR;  Service: Orthopedics;  Laterality: Left;  . 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 Smoking status: Current Every Day Smoker    Packs/day: 1.00    Years: 10.00   Types: Cigarettes  . Smokeless tobacco: Never Used     Comment: stop and started "probably 10 years total over 20 years"   . Alcohol use 21.6 oz/week    36 Cans of beer per week  . Drug use: Yes    Types: Marijuana  . Sexual activity: Not on file

## 2017-07-11 ENCOUNTER — Other Ambulatory Visit (INDEPENDENT_AMBULATORY_CARE_PROVIDER_SITE_OTHER): Payer: Self-pay | Admitting: Family

## 2017-07-11 ENCOUNTER — Telehealth (INDEPENDENT_AMBULATORY_CARE_PROVIDER_SITE_OTHER): Payer: Self-pay | Admitting: Orthopaedic Surgery

## 2017-07-11 MED ORDER — OXYCODONE-ACETAMINOPHEN 5-325 MG PO TABS: 1.0000 | ORAL_TABLET | Freq: Four times a day (QID) | ORAL | 0 refills | Status: DC | PRN

## 2017-07-11 NOTE — Telephone Encounter (Signed)
Patient called left voicemail message needing refill on pain medicine. The number to contact patient is [phone]

## 2017-07-11 NOTE — Telephone Encounter (Signed)
Can you please advise since Dr. Ophelia Charter is out of the office?  Patient had surgery on 07/02/2017 for biceps tendon rupture that could not be repaired. He was prescribed Oxycodone 5/325 1 po q 6 hrs prn pain #30 on 07/02/17.

## 2017-07-11 NOTE — Telephone Encounter (Signed)
Script at front for pick up. Patient advised.  

## 2017-07-16 ENCOUNTER — Encounter (INDEPENDENT_AMBULATORY_CARE_PROVIDER_SITE_OTHER): Payer: Self-pay | Admitting: Surgery

## 2017-07-16 ENCOUNTER — Ambulatory Visit (INDEPENDENT_AMBULATORY_CARE_PROVIDER_SITE_OTHER): Payer: BLUE CROSS/BLUE SHIELD | Admitting: Surgery

## 2017-07-16 DIAGNOSIS — S46212D Strain of muscle, fascia and tendon of other parts of biceps, left arm, subsequent encounter: Secondary | ICD-10-CM

## 2017-07-16 DIAGNOSIS — Z9889 Other specified postprocedural states: Secondary | ICD-10-CM

## 2017-07-16 MED ORDER — OXYCODONE-ACETAMINOPHEN 5-325 MG PO TABS: 1.0000 | ORAL_TABLET | Freq: Three times a day (TID) | ORAL | 0 refills | Status: AC | PRN

## 2017-07-16 NOTE — Patient Instructions (Signed)
Do shoulder strengthening exercises that were given.

## 2017-07-16 NOTE — Progress Notes (Signed)
   Post-Op Visit Note   Patient: Andres Garza           Date of Birth: July 31, 1976           MRN: [id number] Visit Date: 07/16/2017 PCP: Roderick Pee, MD   Assessment & Plan:  Chief Complaint:  Chief Complaint  Patient presents with  . Left Arm - Routine Post Op, Follow-up   Visit Diagnoses:  1. Rupture of left proximal biceps tendon, subsequent encounter   2. Status post arthroscopy of left shoulder     Plan: Shoulder strengthening exercises were given. He will follow-up with [place] when necessary since she is planning on returning back to [place] this week. States that he might be around [place] around Thanksgiving and told him if there are any problems that he can call and schedule an appointment with me. Did refill Percocet.  Follow-Up Instructions: Return if symptoms worsen or fail to improve.   Orders:  No orders of the defined types were placed in this encounter.  Meds ordered this encounter  Medications  . oxyCODONE-acetaminophen (ROXICET) 5-325 MG tablet    Sig: Take 1 tablet by mouth every 8 (eight) hours as needed.    Dispense:  40 tablet    Refill:  0    Imaging: No results found.  PMFS History: Patient Active Problem List   Diagnosis Date Noted  . Biceps rupture, proximal 07/02/2017  . General medical examination 01/30/2011  . Tobacco abuse 01/29/2011  . Family history of premature coronary artery disease 01/29/2011   Past Medical History:  Diagnosis Date  . Anxiety   . Depression    situational    Family History  Problem Relation Age of Onset  . Heart disease Father   . Hyperlipidemia Sister     Past Surgical History:  Procedure Laterality Date  . BICEPT TENODESIS Left 07/02/2017   Procedure: Left Arm Biceps Tenodesis;  Surgeon: Eldred Manges, MD;  Location: [facility] OR;  Service: Orthopedics;  Laterality: Left;  . 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 Smoking status: Current Every Day Smoker    Packs/day: 1.00    Years: 10.00    Types: Cigarettes  . Smokeless tobacco: Never Used     Comment: stop and started "probably 10 years total over 20 years"   . Alcohol use 21.6 oz/week    36 Cans of beer per week  . Drug use: Yes    Types: Marijuana  . Sexual activity: Not on file   Exam Pleasant white male alert and oriented in no acute distress. Surgical incision is well-healed. No drainage or signs of infection. Good elbow and shoulder range of motion.

## 2017-10-10 DIAGNOSIS — G40909 Epilepsy, unspecified, not intractable, without status epilepticus: Secondary | ICD-10-CM | POA: Diagnosis not present

## 2017-10-13 DIAGNOSIS — R569 Unspecified convulsions: Secondary | ICD-10-CM | POA: Diagnosis not present

## 2017-10-16 DIAGNOSIS — G40909 Epilepsy, unspecified, not intractable, without status epilepticus: Secondary | ICD-10-CM | POA: Diagnosis not present

## 2017-10-28 DIAGNOSIS — G40909 Epilepsy, unspecified, not intractable, without status epilepticus: Secondary | ICD-10-CM | POA: Diagnosis not present

## 2017-10-28 DIAGNOSIS — R05 Cough: Secondary | ICD-10-CM | POA: Diagnosis not present

## 2017-10-29 DIAGNOSIS — R05 Cough: Secondary | ICD-10-CM | POA: Diagnosis not present

## 2017-10-29 DIAGNOSIS — G40909 Epilepsy, unspecified, not intractable, without status epilepticus: Secondary | ICD-10-CM | POA: Diagnosis not present

## 2017-10-30 DIAGNOSIS — S22000A Wedge compression fracture of unspecified thoracic vertebra, initial encounter for closed fracture: Secondary | ICD-10-CM | POA: Diagnosis not present

## 2017-11-05 DIAGNOSIS — G40909 Epilepsy, unspecified, not intractable, without status epilepticus: Secondary | ICD-10-CM | POA: Diagnosis not present

## 2017-11-05 DIAGNOSIS — X58XXXA Exposure to other specified factors, initial encounter: Secondary | ICD-10-CM | POA: Diagnosis not present

## 2017-11-05 DIAGNOSIS — G40309 Generalized idiopathic epilepsy and epileptic syndromes, not intractable, without status epilepticus: Secondary | ICD-10-CM | POA: Diagnosis not present

## 2017-11-05 DIAGNOSIS — S22000A Wedge compression fracture of unspecified thoracic vertebra, initial encounter for closed fracture: Secondary | ICD-10-CM | POA: Diagnosis not present

## 2017-11-07 DIAGNOSIS — G40909 Epilepsy, unspecified, not intractable, without status epilepticus: Secondary | ICD-10-CM | POA: Diagnosis not present

## 2017-11-13 DIAGNOSIS — S22000A Wedge compression fracture of unspecified thoracic vertebra, initial encounter for closed fracture: Secondary | ICD-10-CM | POA: Diagnosis not present

## 2017-11-20 DIAGNOSIS — S22000A Wedge compression fracture of unspecified thoracic vertebra, initial encounter for closed fracture: Secondary | ICD-10-CM | POA: Diagnosis not present

## 2017-11-28 DIAGNOSIS — X58XXXA Exposure to other specified factors, initial encounter: Secondary | ICD-10-CM | POA: Diagnosis not present

## 2017-11-28 DIAGNOSIS — S22000A Wedge compression fracture of unspecified thoracic vertebra, initial encounter for closed fracture: Secondary | ICD-10-CM | POA: Diagnosis not present

## 2017-12-02 DIAGNOSIS — S22000A Wedge compression fracture of unspecified thoracic vertebra, initial encounter for closed fracture: Secondary | ICD-10-CM | POA: Diagnosis not present

## 2017-12-08 DIAGNOSIS — G40909 Epilepsy, unspecified, not intractable, without status epilepticus: Secondary | ICD-10-CM | POA: Diagnosis not present

## 2017-12-08 DIAGNOSIS — M545 Low back pain: Secondary | ICD-10-CM | POA: Diagnosis not present

## 2018-01-27 DIAGNOSIS — S22000A Wedge compression fracture of unspecified thoracic vertebra, initial encounter for closed fracture: Secondary | ICD-10-CM | POA: Diagnosis not present

## 2018-01-27 DIAGNOSIS — X58XXXA Exposure to other specified factors, initial encounter: Secondary | ICD-10-CM | POA: Diagnosis not present

## 2018-02-12 DIAGNOSIS — S229XXA Fracture of bony thorax, part unspecified, initial encounter for closed fracture: Secondary | ICD-10-CM | POA: Diagnosis not present

## 2018-04-28 DIAGNOSIS — F411 Generalized anxiety disorder: Secondary | ICD-10-CM | POA: Diagnosis not present

## 2018-05-05 DIAGNOSIS — F411 Generalized anxiety disorder: Secondary | ICD-10-CM | POA: Diagnosis not present

## 2018-05-05 DIAGNOSIS — Z0000 Encounter for general adult medical examination without abnormal findings: Secondary | ICD-10-CM | POA: Diagnosis not present

## 2018-05-07 DIAGNOSIS — E785 Hyperlipidemia, unspecified: Secondary | ICD-10-CM | POA: Diagnosis not present

## 2018-06-23 DIAGNOSIS — E7800 Pure hypercholesterolemia, unspecified: Secondary | ICD-10-CM | POA: Diagnosis not present

## 2018-06-23 DIAGNOSIS — Z8249 Family history of ischemic heart disease and other diseases of the circulatory system: Secondary | ICD-10-CM | POA: Diagnosis not present

## 2018-06-23 DIAGNOSIS — I340 Nonrheumatic mitral (valve) insufficiency: Secondary | ICD-10-CM | POA: Diagnosis not present

## 2018-06-23 DIAGNOSIS — I517 Cardiomegaly: Secondary | ICD-10-CM | POA: Diagnosis not present

## 2018-06-30 DIAGNOSIS — I517 Cardiomegaly: Secondary | ICD-10-CM | POA: Diagnosis not present

## 2018-06-30 DIAGNOSIS — E7800 Pure hypercholesterolemia, unspecified: Secondary | ICD-10-CM | POA: Diagnosis not present

## 2018-06-30 DIAGNOSIS — I340 Nonrheumatic mitral (valve) insufficiency: Secondary | ICD-10-CM | POA: Diagnosis not present

## 2018-07-17 DIAGNOSIS — G40909 Epilepsy, unspecified, not intractable, without status epilepticus: Secondary | ICD-10-CM | POA: Diagnosis not present

## 2018-11-05 DIAGNOSIS — F410 Panic disorder [episodic paroxysmal anxiety] without agoraphobia: Secondary | ICD-10-CM | POA: Diagnosis not present

## 2018-11-05 DIAGNOSIS — F4323 Adjustment disorder with mixed anxiety and depressed mood: Secondary | ICD-10-CM | POA: Diagnosis not present

## 2018-11-12 DIAGNOSIS — F410 Panic disorder [episodic paroxysmal anxiety] without agoraphobia: Secondary | ICD-10-CM | POA: Diagnosis not present

## 2018-11-12 DIAGNOSIS — F4323 Adjustment disorder with mixed anxiety and depressed mood: Secondary | ICD-10-CM | POA: Diagnosis not present

## 2018-11-19 DIAGNOSIS — F410 Panic disorder [episodic paroxysmal anxiety] without agoraphobia: Secondary | ICD-10-CM | POA: Diagnosis not present

## 2018-11-19 DIAGNOSIS — F4323 Adjustment disorder with mixed anxiety and depressed mood: Secondary | ICD-10-CM | POA: Diagnosis not present

## 2018-12-08 DIAGNOSIS — R509 Fever, unspecified: Secondary | ICD-10-CM | POA: Diagnosis not present

## 2018-12-08 DIAGNOSIS — R0781 Pleurodynia: Secondary | ICD-10-CM | POA: Diagnosis not present

## 2018-12-08 DIAGNOSIS — R5383 Other fatigue: Secondary | ICD-10-CM | POA: Diagnosis not present

## 2018-12-08 DIAGNOSIS — J069 Acute upper respiratory infection, unspecified: Secondary | ICD-10-CM | POA: Diagnosis not present

## 2018-12-10 DIAGNOSIS — F410 Panic disorder [episodic paroxysmal anxiety] without agoraphobia: Secondary | ICD-10-CM | POA: Diagnosis not present

## 2018-12-15 DIAGNOSIS — F411 Generalized anxiety disorder: Secondary | ICD-10-CM | POA: Diagnosis not present

## 2018-12-21 ENCOUNTER — Other Ambulatory Visit: Payer: Self-pay

## 2018-12-21 ENCOUNTER — Encounter (HOSPITAL_COMMUNITY): Payer: Self-pay | Admitting: *Deleted

## 2018-12-21 ENCOUNTER — Emergency Department (HOSPITAL_COMMUNITY)
Admission: EM | Admit: 2018-12-21 | Discharge: 2018-12-22 | Disposition: A | Discharge status: Home / Self Care | Payer: BLUE CROSS/BLUE SHIELD | Attending: Emergency Medicine | Admitting: Emergency Medicine

## 2018-12-21 DIAGNOSIS — F17210 Nicotine dependence, cigarettes, uncomplicated: Secondary | ICD-10-CM | POA: Insufficient documentation

## 2018-12-21 DIAGNOSIS — R1011 Right upper quadrant pain: Secondary | ICD-10-CM | POA: Diagnosis not present

## 2018-12-21 DIAGNOSIS — F1290 Cannabis use, unspecified, uncomplicated: Secondary | ICD-10-CM | POA: Diagnosis not present

## 2018-12-21 DIAGNOSIS — R1010 Upper abdominal pain, unspecified: Secondary | ICD-10-CM

## 2018-12-21 DIAGNOSIS — R634 Abnormal weight loss: Secondary | ICD-10-CM | POA: Diagnosis not present

## 2018-12-21 DIAGNOSIS — R1013 Epigastric pain: Secondary | ICD-10-CM | POA: Diagnosis not present

## 2018-12-21 DIAGNOSIS — R000 Tachycardia, unspecified: Secondary | ICD-10-CM | POA: Diagnosis not present

## 2018-12-21 DIAGNOSIS — I2510 Atherosclerotic heart disease of native coronary artery without angina pectoris: Secondary | ICD-10-CM | POA: Diagnosis not present

## 2018-12-21 HISTORY — DX: Atherosclerotic heart disease of native coronary artery without angina pectoris: I25.10

## 2018-12-21 HISTORY — DX: Unspecified convulsions: R56.9

## 2018-12-21 LAB — CBC
HEMATOCRIT: 44.4 % (ref 39.0–52.0)
Hemoglobin: 14.9 g/dL (ref 13.0–17.0)
MCH: 30.7 pg (ref 26.0–34.0)
MCHC: 33.6 g/dL (ref 30.0–36.0)
MCV: 91.5 fL (ref 80.0–100.0)
Platelets: 353 10*3/uL (ref 150–400)
RBC: 4.85 MIL/uL (ref 4.22–5.81)
RDW: 12.1 % (ref 11.5–15.5)
WBC: 10.7 10*3/uL — ABNORMAL HIGH (ref 4.0–10.5)
nRBC: 0 % (ref 0.0–0.2)

## 2018-12-21 LAB — I-STAT TROPONIN, ED: Troponin i, poc: 0 ng/mL (ref 0.00–0.08)

## 2018-12-21 LAB — COMPREHENSIVE METABOLIC PANEL
ALBUMIN: 4.6 g/dL (ref 3.5–5.0)
ALT: 51 U/L — ABNORMAL HIGH (ref 0–44)
AST: 32 U/L (ref 15–41)
Alkaline Phosphatase: 69 U/L (ref 38–126)
Anion gap: 12 (ref 5–15)
BUN: 8 mg/dL (ref 6–20)
CHLORIDE: 104 mmol/L (ref 98–111)
CO2: 19 mmol/L — ABNORMAL LOW (ref 22–32)
Calcium: 9.8 mg/dL (ref 8.9–10.3)
Creatinine, Ser: 0.79 mg/dL (ref 0.61–1.24)
GFR calc Af Amer: >60 mL/min (ref >60)
GFR calc non Af Amer: >60 mL/min (ref >60)
Glucose, Bld: 106 mg/dL — ABNORMAL HIGH (ref 70–99)
Potassium: 3.5 mmol/L (ref 3.5–5.1)
Sodium: 135 mmol/L (ref 135–145)
Total Bilirubin: 1.1 mg/dL (ref 0.3–1.2)
Total Protein: 7.6 g/dL (ref 6.5–8.1)

## 2018-12-21 LAB — URINALYSIS, ROUTINE W REFLEX MICROSCOPIC
Bilirubin Urine: NEGATIVE
Glucose, UA: NEGATIVE mg/dL
Hgb urine dipstick: NEGATIVE
Ketones, ur: 20 mg/dL — AB
Leukocytes,Ua: NEGATIVE
Nitrite: NEGATIVE
Protein, ur: 30 mg/dL — AB
Specific Gravity, Urine: 1.027 (ref 1.005–1.030)
pH: 5 (ref 5.0–8.0)

## 2018-12-21 LAB — LIPASE, BLOOD: Lipase: 37 U/L (ref 11–51)

## 2018-12-21 MED ORDER — SODIUM CHLORIDE 0.9% FLUSH
3.0000 mL | Freq: Once | INTRAVENOUS | Status: AC
  Administered 2018-12-22: 3 mL via INTRAVENOUS

## 2018-12-21 NOTE — ED Triage Notes (Signed)
Pt reports pain to Lt side ,flank, radiates to back . Pt also reports N/V/D. Pt has also been taking laxative  Causing diarrhea.

## 2018-12-22 MED ORDER — LIDOCAINE VISCOUS HCL 2 % MT SOLN
15.0000 mL | Freq: Once | OROMUCOSAL | Status: AC
  Administered 2018-12-22: 15 mL via ORAL
  Filled 2018-12-22: qty 15

## 2018-12-22 MED ORDER — ALUM & MAG HYDROXIDE-SIMETH 200-200-20 MG/5ML PO SUSP
30.0000 mL | Freq: Once | ORAL | Status: AC
  Administered 2018-12-22: 30 mL via ORAL
  Filled 2018-12-22: qty 30

## 2018-12-22 MED ORDER — FAMOTIDINE IN NACL 20-0.9 MG/50ML-% IV SOLN
20.0000 mg | Freq: Once | INTRAVENOUS | Status: AC
  Administered 2018-12-22: 20 mg via INTRAVENOUS
  Filled 2018-12-22: qty 50

## 2018-12-22 MED ORDER — SODIUM CHLORIDE 0.9 % IV BOLUS
1000.0000 mL | Freq: Once | INTRAVENOUS | Status: AC
  Administered 2018-12-22: 1000 mL via INTRAVENOUS

## 2018-12-22 MED ORDER — ONDANSETRON 4 MG PO TBDP: 4.0000 mg | ORAL_TABLET | Freq: Three times a day (TID) | ORAL | 0 refills | Status: AC | PRN

## 2018-12-22 MED ORDER — FAMOTIDINE 20 MG PO TABS: 20.0000 mg | ORAL_TABLET | Freq: Two times a day (BID) | ORAL | 0 refills | Status: DC

## 2018-12-22 MED ORDER — SUCRALFATE 1 G PO TABS: 1.0000 g | ORAL_TABLET | Freq: Three times a day (TID) | ORAL | 0 refills | Status: AC

## 2018-12-22 NOTE — Discharge Instructions (Signed)
1. Medications: Start taking Pepcid twice daily.  You can take Carafate with meals and at night.  Take Zofran as needed for nausea.  Let this medication dissolve under your tongue and wait around 10-15 minutes before eating or drinking after taking this medication. 2. Treatment: rest, drink plenty of fluids, eat a diet of bland foods that will not upset your stomach.  Avoid fried foods, spicy foods, fatty foods, or alcohol.  Do not eat meals too close to bedtime and elevate the head of the bed. 3. Follow Up: Please followup with your primary doctor as scheduled in 3 days for discussion of your diagnoses and further evaluation after today's visit; if you do not have a primary care doctor use the resource guide provided to find one; Please return to the ER for persistent vomiting, high fevers or worsening symptoms

## 2018-12-22 NOTE — ED Provider Notes (Signed)
[facility] EMERGENCY DEPARTMENT Provider Note   CSN: [id number] Arrival date & time: 12/21/18  1845    History   Chief Complaint Chief Complaint  Patient presents with  . Flank Pain    HPI Andres Garza is a 43 y.o. male with history of anxiety, CAD, depression, seizures presents for evaluation of acute onset, progressively worsening upper abdominal pain for 1.5 weeks.  He reports an intermittent sharp pain primarily to the epigastric region which will radiate sometimes to the right upper quadrant into the back.  He reports the pain significantly worsens within minutes of eating anything so he has had decreased oral intake for the last week and a half.  He reports that he has lost approximately 12 pounds since Thursday.  Denies fevers, chills, chest pain, shortness of breath, or urinary symptoms.  He states that he thought he was constipated and "I have not had a good bowel movement in a couple of weeks "so he has been taking laxatives.  He reports that he has been having bowel movements with the laxatives but when he is not taking it he once again is constipated.  No prior history of abdominal surgeries. States diet generally comprised of foods such as pizza and ramen noodles.     The history is provided by the patient.    Past Medical History:  Diagnosis Date  . Anxiety   . Coronary artery disease   . Depression    situational  . Seizures [facility])     Patient Active Problem List   Diagnosis Date Noted  . Biceps rupture, proximal 07/02/2017  . General medical examination 01/30/2011  . Tobacco abuse 01/29/2011  . Family history of premature coronary artery disease 01/29/2011    Past Surgical History:  Procedure Laterality Date  . BICEPT TENODESIS Left 07/02/2017   Procedure: Left Arm Biceps Tenodesis;  Surgeon: Eldred Manges, MD;  Location: [facility] OR;  Service: Orthopedics;  Laterality: Left;  . WISDOM TOOTH EXTRACTION  1990s        Home Medications     Prior to Admission medications   Medication Sig Start Date End Date Taking? Authorizing Provider  famotidine (PEPCID) 20 MG tablet Take 1 tablet (20 mg total) by mouth 2 (two) times daily. 12/22/18   Helmut Hennon A, PA-C  ibuprofen (ADVIL,MOTRIN) 200 MG tablet Take 600-800 mg by mouth every 8 (eight) hours as needed (for pain/inflammation.).    [provider]  ondansetron (ZOFRAN ODT) 4 MG disintegrating tablet Take 1 tablet (4 mg total) by mouth every 8 (eight) hours as needed for nausea or vomiting. 12/22/18   Luevenia Maxin, Hollyn Stucky A, PA-C  sucralfate (CARAFATE) 1 g tablet Take 1 tablet (1 g total) by mouth 4 (four) times daily -  with meals and at bedtime. 12/22/18   Jeanie Sewer, PA-C    Family History Family History  Problem Relation Age of Onset  . Heart disease Father   . Hyperlipidemia Sister     Social History Social History   Tobacco Use  . Smoking status: Current Every Day Smoker    Packs/day: 1.00    Years: 10.00    Pack years: 10.00    Types: Cigarettes  . Smokeless tobacco: Never Used  . Tobacco comment: stop and started "probably 10 years total over 20 years"   Substance Use Topics  . Alcohol use: Yes    Alcohol/week: 36.0 standard drinks    Types: 36 Cans of beer per week  .  Drug use: Yes    Types: Marijuana     Allergies   Patient has no known allergies.   Review of Systems Review of Systems  Constitutional: Negative for chills and fever.  Respiratory: Negative for shortness of breath.   Cardiovascular: Negative for chest pain.  Gastrointestinal: Positive for abdominal pain and constipation. Negative for blood in stool, nausea and vomiting.  Genitourinary: Negative for dysuria, frequency, hematuria and urgency.  All other systems reviewed and are negative.    Physical Exam Updated Vital Signs BP (!) 129/95   Pulse 80   Temp 98.8 F (37.1 C) (Oral)   Resp (!) 24   Ht 5\' 10"  (1.778 m)   Wt 108 kg   SpO2 95%   BMI 34.15 kg/m   Physical  Exam Vitals signs and nursing note reviewed.  Constitutional:      General: He is not in acute distress.    Appearance: Normal appearance. He is well-developed.  HENT:     Head: Normocephalic and atraumatic.  Eyes:     General:        Right eye: No discharge.        Left eye: No discharge.     Conjunctiva/sclera: Conjunctivae normal.  Neck:     Vascular: No JVD.     Trachea: No tracheal deviation.  Cardiovascular:     Rate and Rhythm: Normal rate and regular rhythm.  Pulmonary:     Effort: Pulmonary effort is normal.     Breath sounds: Normal breath sounds.  Abdominal:     General: Abdomen is flat. Bowel sounds are decreased. There is no distension.     Palpations: Abdomen is soft.     Tenderness: There is abdominal tenderness in the right upper quadrant and epigastric area. There is no left CVA tenderness, guarding or rebound. Negative signs include Murphy's sign and Rovsing's sign.  Skin:    General: Skin is warm and dry.     Findings: No erythema.  Neurological:     Mental Status: He is alert.  Psychiatric:        Behavior: Behavior normal.      ED Treatments / Results  Labs (all labs ordered are listed, but only abnormal results are displayed) Labs Reviewed  COMPREHENSIVE METABOLIC PANEL - Abnormal; Notable for the following components:      Result Value   CO2 19 (*)    Glucose, Bld 106 (*)    ALT 51 (*)    All other components within normal limits  CBC - Abnormal; Notable for the following components:   WBC 10.7 (*)    All other components within normal limits  URINALYSIS, ROUTINE W REFLEX MICROSCOPIC - Abnormal; Notable for the following components:   Color, Urine AMBER (*)    APPearance HAZY (*)    Ketones, ur 20 (*)    Protein, ur 30 (*)    Bacteria, UA RARE (*)    All other components within normal limits  LIPASE, BLOOD  I-STAT TROPONIN, ED    EKG EKG Interpretation  Date/Time:  Monday December 21 2018 19:23:30 EDT Ventricular Rate:  103 PR  Interval:  170 QRS Duration: 92 QT Interval:  344 QTC Calculation: 450 R Axis:   -56 Text Interpretation:  Sinus tachycardia Confirmed by Palumbo, April ([id number]) on 12/22/2018 3:59:17 AM   Radiology No results found.  Procedures Procedures (including critical care time)  Medications Ordered in ED Medications  sodium chloride flush (NS) 0.9 % injection 3 mL (3  mLs Intravenous Given 12/22/18 0505)  sodium chloride 0.9 % bolus 1,000 mL (0 mLs Intravenous Stopped 12/22/18 0600)  famotidine (PEPCID) IVPB 20 mg premix (0 mg Intravenous Stopped 12/22/18 0540)  alum & mag hydroxide-simeth (MAALOX/MYLANTA) 200-200-20 MG/5ML suspension 30 mL (30 mLs Oral Given 12/22/18 0456)    And  lidocaine (XYLOCAINE) 2 % viscous mouth solution 15 mL (15 mLs Oral Given 12/22/18 0456)     Initial Impression / Assessment and Plan / ED Course  I have reviewed the triage vital signs and the nursing notes.  Pertinent labs & imaging results that were available during my care of the patient were reviewed by me and considered in my medical decision making (see chart for details).        Patient presents for evaluation of upper abdominal pain with associated constipation for 1.5 weeks.  He is afebrile, vital signs are stable.  He is nontoxic in appearance.  No peritoneal signs on examination of the abdomen.  Lab work reviewed by me shows mild nonspecific leukocytosis, no anemia, no metabolic derangements, no renal insufficiency.  They are overall unremarkable.  UA does not suggest UTI or nephrolithiasis.  His symptoms sound mostly consistent with GERD/peptic ulcer disease/gastritis.  He was given IV fluids, Pepcid and GI cocktail with significant improvement in his pain which is reassuring.  Given benign examination and reassuring lab work, we discussed the utility of a CT scan of the abdomen as well as risks.  Shared decision making conversation was had and he elects to hold off on any imaging at this time which I think  is reasonable given symptomatic improvement.  Doubt acute surgical abdominal pathology or cardiopulmonary etiology of symptoms.  He has a follow-up with his PCP in 3 days to discuss his symptoms.  Will discharge with course of Pepcid and Carafate.  Zofran as needed nausea.  Discussed lifestyle modification, dietary modification, and avoidance of NSAIDs.  Discussed strict ED return precautions.  Patient and mother verbalized understanding of and agreement with plan and patient stable for discharge home at this time.  Final Clinical Impressions(s) / ED Diagnoses   Final diagnoses:  Upper abdominal pain    ED Discharge Orders         Ordered    ondansetron (ZOFRAN ODT) 4 MG disintegrating tablet  Every 8 hours PRN     12/22/18 0609    famotidine (PEPCID) 20 MG tablet  2 times daily     12/22/18 0609    sucralfate (CARAFATE) 1 g tablet  3 times daily with meals & bedtime     12/22/18 0609           Jeanie Sewer, PA-C 12/23/18 0310    Palumbo, April, MD 12/23/18 2304

## 2018-12-22 NOTE — ED Notes (Signed)
Reviewed d/c instructions with pt, who verbalized understanding and had no outstanding questions. Pt armband & labels removed and placed in shred bin. Pt departed in NAD, refused use of wheelchair.   

## 2018-12-24 ENCOUNTER — Ambulatory Visit (INDEPENDENT_AMBULATORY_CARE_PROVIDER_SITE_OTHER): Payer: BLUE CROSS/BLUE SHIELD | Admitting: Internal Medicine

## 2018-12-24 ENCOUNTER — Encounter: Payer: Self-pay | Admitting: Internal Medicine

## 2018-12-24 ENCOUNTER — Other Ambulatory Visit: Payer: Self-pay

## 2018-12-24 VITALS — BP 120/74 | HR 72 | Temp 98.1°F | Ht 70.0 in | Wt 246.6 lb

## 2018-12-24 DIAGNOSIS — G40909 Epilepsy, unspecified, not intractable, without status epilepticus: Secondary | ICD-10-CM

## 2018-12-24 DIAGNOSIS — R1013 Epigastric pain: Secondary | ICD-10-CM | POA: Diagnosis not present

## 2018-12-24 DIAGNOSIS — Z8249 Family history of ischemic heart disease and other diseases of the circulatory system: Secondary | ICD-10-CM

## 2018-12-24 DIAGNOSIS — Z23 Encounter for immunization: Secondary | ICD-10-CM | POA: Diagnosis not present

## 2018-12-24 DIAGNOSIS — Z720 Tobacco use: Secondary | ICD-10-CM | POA: Diagnosis not present

## 2018-12-24 MED ORDER — LEVETIRACETAM 1000 MG PO TABS: 1000.0000 mg | ORAL_TABLET | Freq: Two times a day (BID) | ORAL | Status: AC

## 2018-12-24 MED ORDER — PANTOPRAZOLE SODIUM 40 MG PO TBEC: 40.0000 mg | DELAYED_RELEASE_TABLET | Freq: Every day | ORAL | 3 refills | Status: AC

## 2018-12-24 NOTE — Patient Instructions (Signed)
-  Nice meeting you today and I hope you feel better soon!!  -Stop Famotidine. Start taking Protonix 40 mg daily.  -Will send you for an ultrasound of your abdomen. Will notify you when results are available.  -Try to quit smoking and alcohol consumption. Let us know if you need help!

## 2018-12-24 NOTE — Progress Notes (Signed)
New Patient Office Visit     CC/Reason for Visit: Abd pain Previous PCP: Dr. Tawanna Cooler Last Visit: 2012  HPI: Andres Garza is a 43 y.o. male who is coming in today for the above mentioned reasons. Past Medical History is significant for: Obesity, tobacco abuse, family history of premature coronary artery disease, seizure disorder on Keppra.  He lives most of the year in [place] and has routine medical care there, however his mother is local and he spends a few months of the year here, he would like to establish care in our clinic in case he has any issues when he is in [place].  He smokes 1 pack a day has been tried to quit unsuccessfully, drinks alcohol daily but states since December he has been using it more socially.  He used to drink a beer and a shot of hard liquor every night.  Last weekend he had to go to the emergency department due to epigastric and left upper quadrant pain that was radiating into the back, sometimes to the right upper quadrant, did not have nausea and vomiting.  In the emergency department he had EKG, troponin without acute findings.,  Based on his symptoms they thought he had GERD as he improved with a GI cocktail and so was discharged with Zofran, Pepcid and sucralfate, he has had some relief with these medications although not complete.  I have read the ED provider note in detail and it appears that she considered a CT scan but decided against it given improvement of symptoms with treatment for GERD/peptic ulcer disease.   Past Medical/Surgical History: Past Medical History:  Diagnosis Date  . Anxiety   . Coronary artery disease   . Depression    situational  . Seizures (HCC)     Past Surgical History:  Procedure Laterality Date  . BICEPT TENODESIS Left 07/02/2017   Procedure: Left Arm Biceps Tenodesis;  Surgeon: Eldred Manges, MD;  Location: [facility] OR;  Service: Orthopedics;  Laterality: Left;  . WISDOM TOOTH EXTRACTION  1990s    Social  History:  reports that he has been smoking cigarettes. He has a 10.00 pack-year smoking history. He has never used smokeless tobacco. He reports current alcohol use of about 36.0 standard drinks of alcohol per week. He reports current drug use. Drug: Marijuana.  Allergies: No Known Allergies  Family History:  Family History  Problem Relation Age of Onset  . Heart disease Father   . Hyperlipidemia Sister      Current Outpatient Medications:  .  ondansetron (ZOFRAN ODT) 4 MG disintegrating tablet, Take 1 tablet (4 mg total) by mouth every 8 (eight) hours as needed for nausea or vomiting., Disp: 10 tablet, Rfl: 0 .  sucralfate (CARAFATE) 1 g tablet, Take 1 tablet (1 g total) by mouth 4 (four) times daily -  with meals and at bedtime., Disp: 30 tablet, Rfl: 0 .  levETIRAcetam (KEPPRA) 1000 MG tablet, Take 1 tablet (1,000 mg total) by mouth 2 (two) times daily., Disp: , Rfl:  .  pantoprazole (PROTONIX) 40 MG tablet, Take 1 tablet (40 mg total) by mouth daily., Disp: 30 tablet, Rfl: 3  Review of Systems:  Constitutional: Denies fever, chills, diaphoresis, appetite change and fatigue.  HEENT: Denies photophobia, eye pain, redness, hearing loss, ear pain, congestion, sore throat, rhinorrhea, sneezing, mouth sores, trouble swallowing, neck pain, neck stiffness and tinnitus.   Respiratory: Denies SOB, DOE, cough, chest tightness,  and wheezing.   Cardiovascular: Denies  chest pain, palpitations and leg swelling.  Gastrointestinal: Denies nausea, vomiting, diarrhea, constipation, blood in stool and abdominal distention.  Genitourinary: Denies dysuria, urgency, frequency, hematuria, flank pain and difficulty urinating.  Endocrine: Denies: hot or cold intolerance, sweats, changes in hair or nails, polyuria, polydipsia. Musculoskeletal: Denies myalgias, back pain, joint swelling, arthralgias and gait problem.  Skin: Denies pallor, rash and wound.  Neurological: Denies dizziness, seizures, syncope,  weakness, light-headedness, numbness and headaches.  Hematological: Denies adenopathy. Easy bruising, personal or family bleeding history  Psychiatric/Behavioral: Denies suicidal ideation, mood changes, confusion, nervousness, sleep disturbance and agitation    Physical Exam: Vitals:   12/24/18 0710  BP: 120/74  Pulse: 72  Temp: 98.1 F (36.7 C)  TempSrc: Oral  SpO2: 96%  Weight: 246 lb 9.6 oz (111.9 kg)  Height: 5\' 10"  (1.778 m)   Body mass index is 35.38 kg/m.  Constitutional: NAD, calm, comfortable Eyes: PERRL, lids and conjunctivae normal ENMT: Mucous membranes are moist.  Respiratory: clear to auscultation bilaterally, no wheezing, no crackles. Normal respiratory effort. No accessory muscle use.  Cardiovascular: Regular rate and rhythm, no murmurs / rubs / gallops. No extremity edema. 2+ pedal pulses. No carotid bruits.  Abdomen: no masses palpated. No hepatosplenomegaly. Bowel sounds positive.  Slight tenderness to palpation of right upper and left upper quadrant Musculoskeletal: no clubbing / cyanosis. No joint deformity upper and lower extremities. Good ROM, no contractures. Normal muscle tone.  Neurologic: Grossly intact and nonfocal Psychiatric: Normal judgment and insight. Alert and oriented x 3. Normal mood.    Impression and Plan:  Epigastric pain  -I am certainly concerned for an episode of mild acute pancreatitis given the localization of his symptoms as well as his history of alcohol abuse, he uses NSAIDs only sporadically. -His lipase in the ED was normal, however no imaging was performed. -Given right upper quadrant pain elicited on exam, will request ultrasound of the abdomen. -He has improved on a bland diet, have continued to recommend fat-free diet for now until abdominal pain completely resolves. -Since he has had some improvement on medications sent home from the ED, have decided to discontinue Pepcid and instead start a PPI to see if he has improved  resolution of symptoms, if this is the case would lean more towards GERD/peptic ulcer disease as a diagnosis.  Tobacco abuse -We did not have a lot of time to discuss this today, will continue to address at subsequent visits.  Family history of premature coronary artery disease -Father died of MI in his 68s.  Needs flu shot - Plan: Flu Vaccine QUAD 6+ mos PF IM (Fluarix Quad PF)  Seizure disorder (HCC) - Plan: levETIRAcetam (KEPPRA) 1000 MG tablet     Patient Instructions  -Nice meeting you today and I hope you feel better soon!!  -Stop Famotidine. Start taking Protonix 40 mg daily.  -Will send you for an ultrasound of your abdomen. Will notify you when results are available.  -Try to quit smoking and alcohol consumption. Let us know if you need help!     Chaya Jan, MD [facility]

## 2018-12-30 ENCOUNTER — Ambulatory Visit
Admission: RE | Admit: 2018-12-30 | Discharge: 2018-12-30 | Disposition: A | Discharge status: Home / Self Care | Payer: BLUE CROSS/BLUE SHIELD | Source: Ambulatory Visit | Attending: Internal Medicine | Admitting: Internal Medicine

## 2018-12-30 DIAGNOSIS — K760 Fatty (change of) liver, not elsewhere classified: Secondary | ICD-10-CM | POA: Diagnosis not present

## 2018-12-30 DIAGNOSIS — R1013 Epigastric pain: Secondary | ICD-10-CM

## 2019-03-15 DIAGNOSIS — F411 Generalized anxiety disorder: Secondary | ICD-10-CM | POA: Diagnosis not present

## 2019-03-15 DIAGNOSIS — E785 Hyperlipidemia, unspecified: Secondary | ICD-10-CM | POA: Diagnosis not present

## 2019-06-29 DIAGNOSIS — F411 Generalized anxiety disorder: Secondary | ICD-10-CM | POA: Diagnosis not present

## 2019-11-09 DIAGNOSIS — F411 Generalized anxiety disorder: Secondary | ICD-10-CM | POA: Diagnosis not present

## 2019-12-03 DIAGNOSIS — Z20822 Contact with and (suspected) exposure to covid-19: Secondary | ICD-10-CM | POA: Diagnosis not present

## 2019-12-04 DIAGNOSIS — Z20822 Contact with and (suspected) exposure to covid-19: Secondary | ICD-10-CM | POA: Diagnosis not present

## 2019-12-04 DIAGNOSIS — Z03818 Encounter for observation for suspected exposure to other biological agents ruled out: Secondary | ICD-10-CM | POA: Diagnosis not present

## 2019-12-25 DIAGNOSIS — S60212A Contusion of left wrist, initial encounter: Secondary | ICD-10-CM | POA: Diagnosis not present

## 2019-12-25 DIAGNOSIS — S5012XA Contusion of left forearm, initial encounter: Secondary | ICD-10-CM | POA: Diagnosis not present

## 2019-12-25 DIAGNOSIS — S5002XA Contusion of left elbow, initial encounter: Secondary | ICD-10-CM | POA: Diagnosis not present

## 2019-12-28 DIAGNOSIS — S52122A Displaced fracture of head of left radius, initial encounter for closed fracture: Secondary | ICD-10-CM | POA: Diagnosis not present

## 2020-01-07 DIAGNOSIS — X58XXXA Exposure to other specified factors, initial encounter: Secondary | ICD-10-CM | POA: Diagnosis not present

## 2020-01-07 DIAGNOSIS — S52125A Nondisplaced fracture of head of left radius, initial encounter for closed fracture: Secondary | ICD-10-CM | POA: Diagnosis not present

## 2020-01-13 DIAGNOSIS — S52122A Displaced fracture of head of left radius, initial encounter for closed fracture: Secondary | ICD-10-CM | POA: Diagnosis not present

## 2020-01-20 DIAGNOSIS — X58XXXA Exposure to other specified factors, initial encounter: Secondary | ICD-10-CM | POA: Diagnosis not present

## 2020-01-20 DIAGNOSIS — M25522 Pain in left elbow: Secondary | ICD-10-CM | POA: Diagnosis not present

## 2020-01-20 DIAGNOSIS — S52135A Nondisplaced fracture of neck of left radius, initial encounter for closed fracture: Secondary | ICD-10-CM | POA: Diagnosis not present

## 2020-03-27 DIAGNOSIS — F411 Generalized anxiety disorder: Secondary | ICD-10-CM | POA: Diagnosis not present

## 2020-03-27 IMAGING — US ULTRASOUND ABDOMEN COMPLETE
1 series · 14 of 25 positions shown · non-contrast
Comparison: None.

CLINICAL DATA: Epigastric and left upper quadrant pain for 3 weeks.

EXAM:
ABDOMEN ULTRASOUND COMPLETE

[Series 1: ultrasound abdomen complete · 0.28mm/px · 14 of 91 slices shown]
[im 1/91]
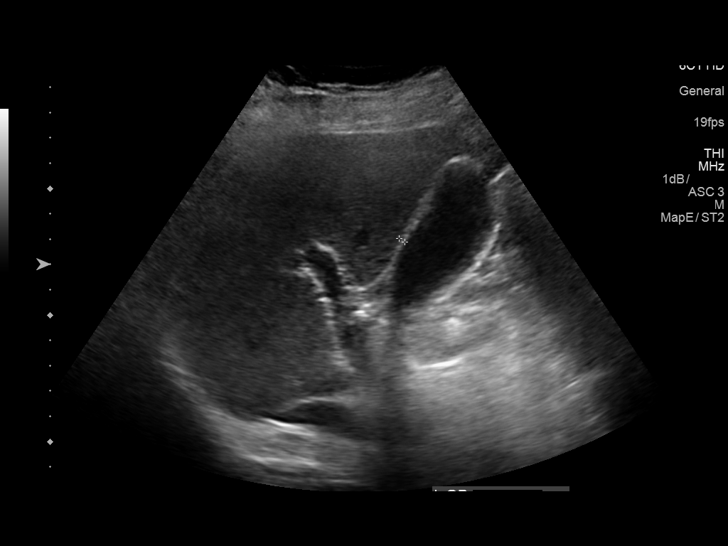
[im 8/91]
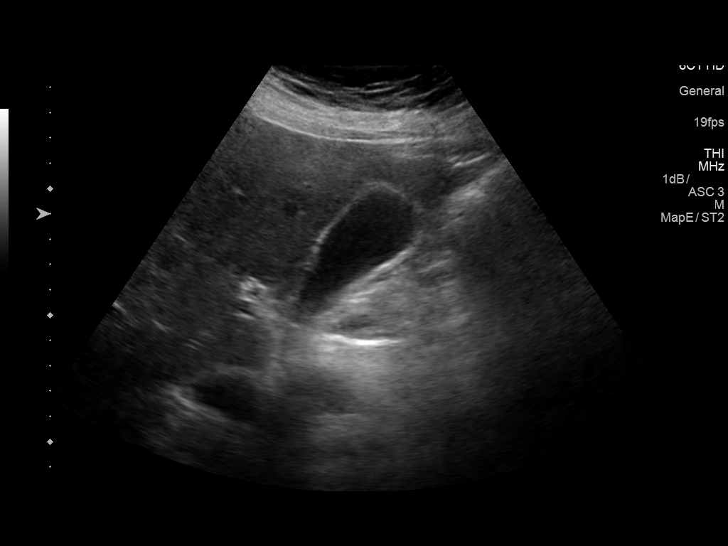
[im 16/91]
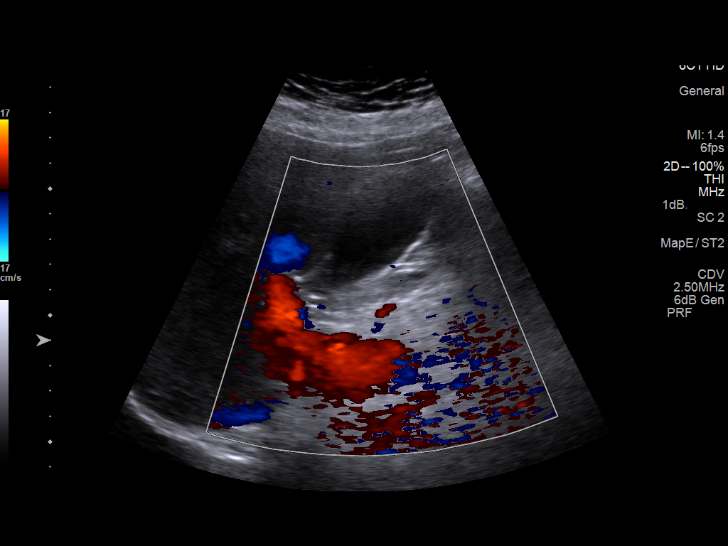
[im 23/91]
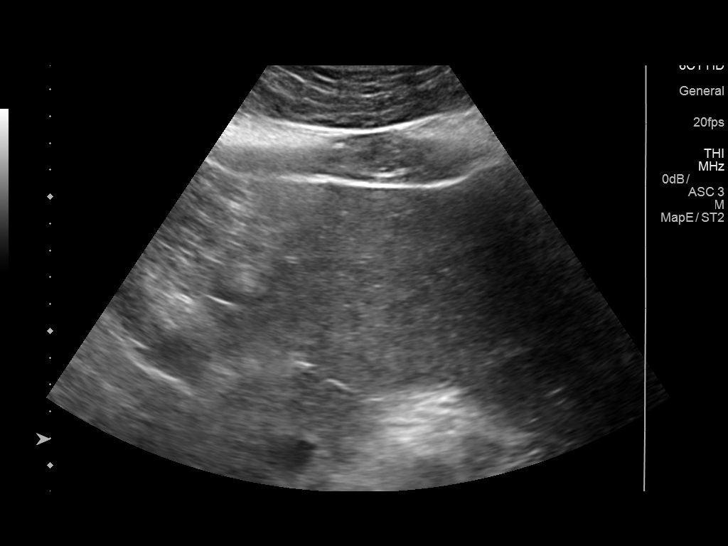
[im 31/91]
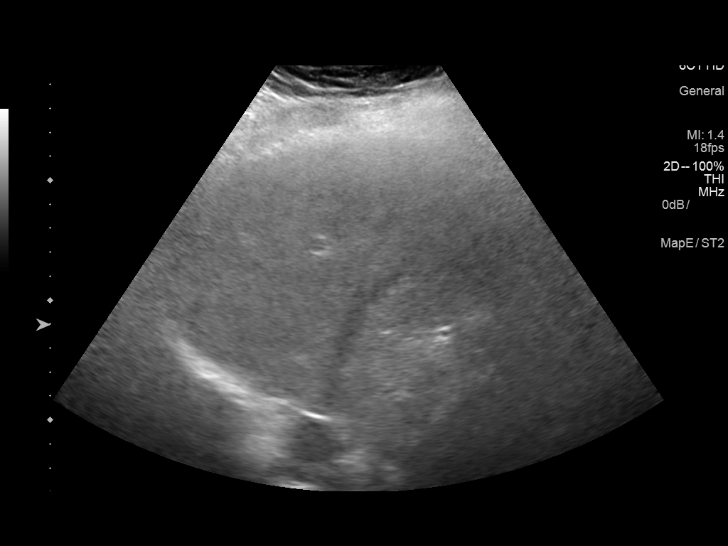
[im 34/91]
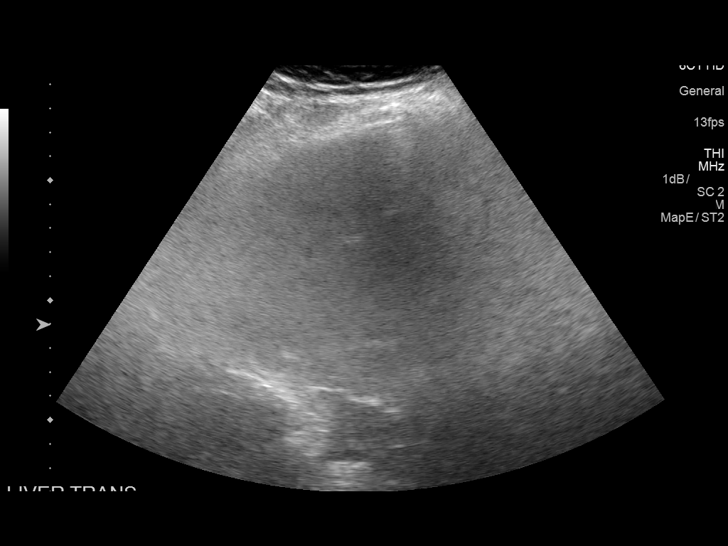
[im 42/91]
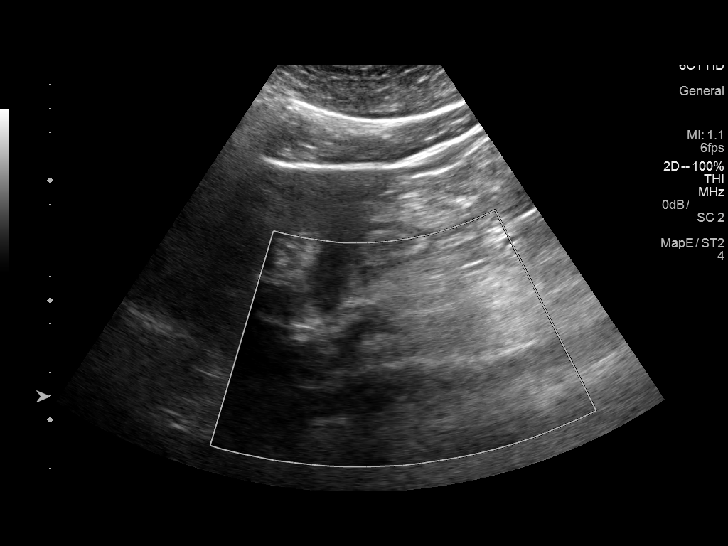
[im 49/91]
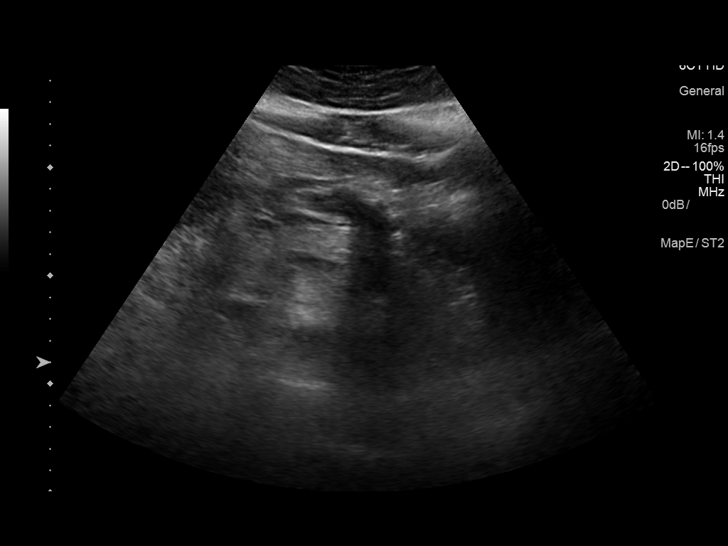
[im 57/91]
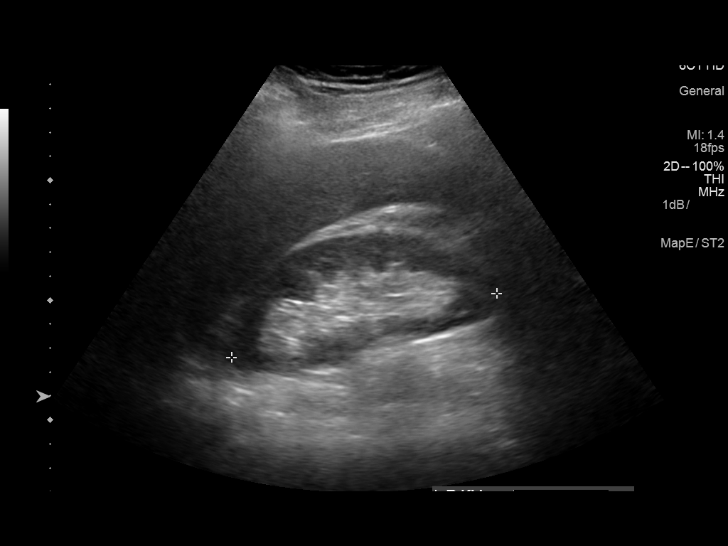
[im 61/91]
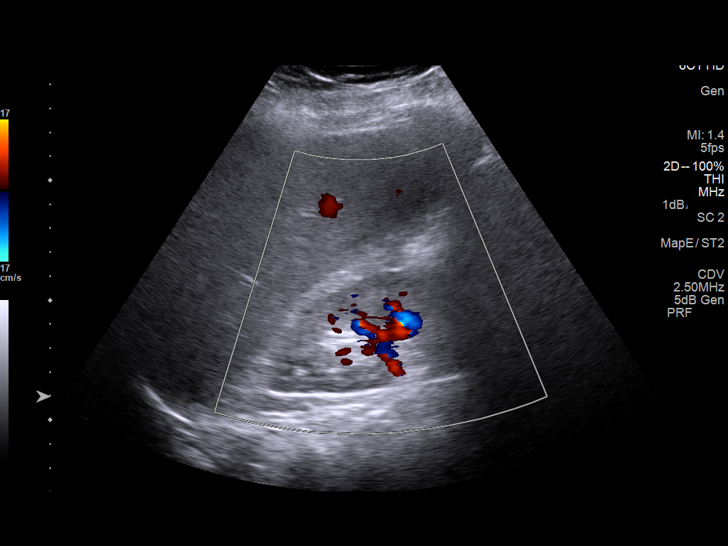
[im 68/91]
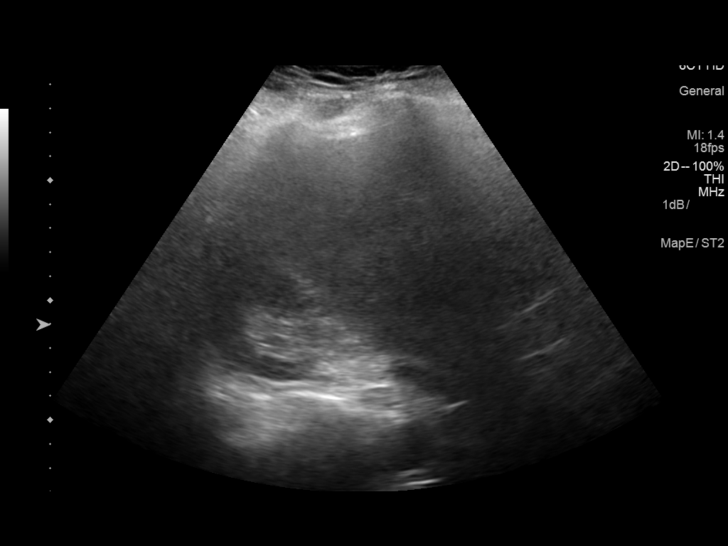
[im 76/91]
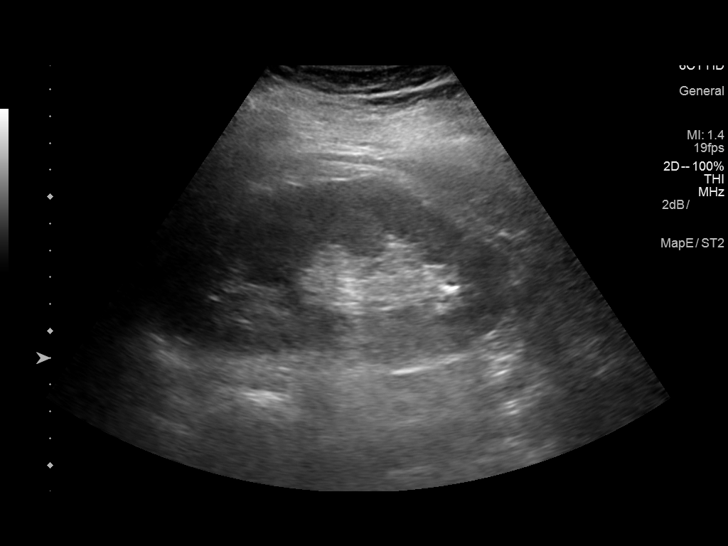
[im 83/91]
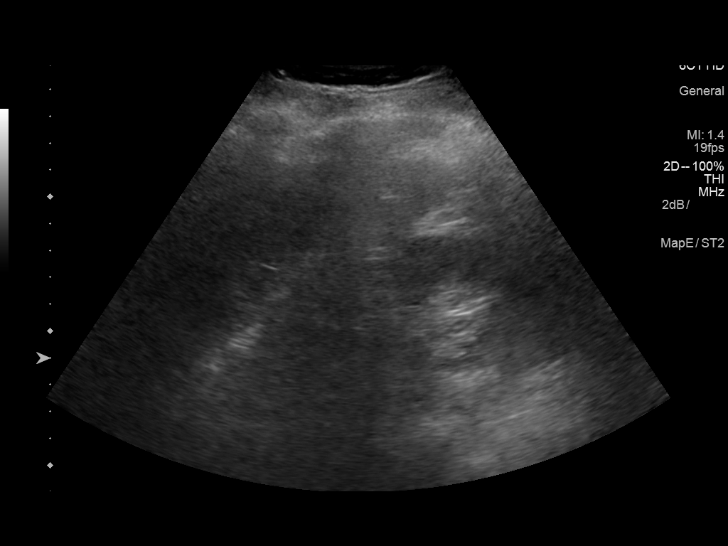
[im 91/91]
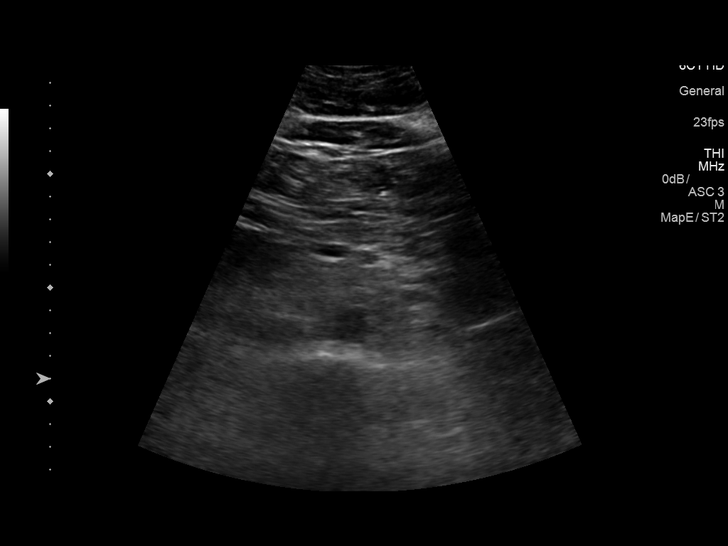

[14 of 25 positions shown; findings below may reference images not displayed]

FINDINGS: Gallbladder: No gallstones or wall thickening visualized. No
sonographic Murphy sign noted by sonographer.

Common bile duct: Diameter: 0.4 cm.

Liver: The liver appears dense with increased echogenicity. No focal
lesion. Portal vein is patent on color Doppler imaging with normal
direction of blood flow towards the liver.

IVC: No abnormality visualized.

Pancreas: Visualized portion unremarkable.

Spleen: Size and appearance within normal limits.

Right Kidney: Length: 11.4 cm. Echogenicity within normal limits. No
mass or hydronephrosis visualized.

Left Kidney: Length: 13.0 cm. Echogenicity within normal limits. No
mass or hydronephrosis visualized.

Abdominal aorta: No aneurysm visualized.

Other findings: None.
IMPRESSION: No acute abnormality.  Negative for gallstones.

Fatty infiltration liver.

## 2020-06-27 DIAGNOSIS — G40909 Epilepsy, unspecified, not intractable, without status epilepticus: Secondary | ICD-10-CM | POA: Diagnosis not present

## 2020-06-27 DIAGNOSIS — F411 Generalized anxiety disorder: Secondary | ICD-10-CM | POA: Diagnosis not present

## 2020-07-27 DIAGNOSIS — F411 Generalized anxiety disorder: Secondary | ICD-10-CM | POA: Diagnosis not present

## 2020-07-27 DIAGNOSIS — Z23 Encounter for immunization: Secondary | ICD-10-CM | POA: Diagnosis not present

## 2020-07-31 DIAGNOSIS — Z0000 Encounter for general adult medical examination without abnormal findings: Secondary | ICD-10-CM | POA: Diagnosis not present

## 2020-10-04 DIAGNOSIS — Z20822 Contact with and (suspected) exposure to covid-19: Secondary | ICD-10-CM | POA: Diagnosis not present

## 2020-10-10 DIAGNOSIS — Z20822 Contact with and (suspected) exposure to covid-19: Secondary | ICD-10-CM | POA: Diagnosis not present

## 2020-10-23 DIAGNOSIS — J069 Acute upper respiratory infection, unspecified: Secondary | ICD-10-CM | POA: Diagnosis not present

## 2020-10-23 DIAGNOSIS — Z20828 Contact with and (suspected) exposure to other viral communicable diseases: Secondary | ICD-10-CM | POA: Diagnosis not present

## 2020-11-20 DIAGNOSIS — S4991XA Unspecified injury of right shoulder and upper arm, initial encounter: Secondary | ICD-10-CM | POA: Diagnosis not present

## 2020-11-20 DIAGNOSIS — S63501A Unspecified sprain of right wrist, initial encounter: Secondary | ICD-10-CM | POA: Diagnosis not present

## 2020-11-20 DIAGNOSIS — S335XXA Sprain of ligaments of lumbar spine, initial encounter: Secondary | ICD-10-CM | POA: Diagnosis not present

## 2020-11-21 DIAGNOSIS — M7981 Nontraumatic hematoma of soft tissue: Secondary | ICD-10-CM | POA: Diagnosis not present

## 2020-11-23 DIAGNOSIS — S63501A Unspecified sprain of right wrist, initial encounter: Secondary | ICD-10-CM | POA: Diagnosis not present

## 2020-11-23 DIAGNOSIS — S335XXA Sprain of ligaments of lumbar spine, initial encounter: Secondary | ICD-10-CM | POA: Diagnosis not present

## 2020-11-23 DIAGNOSIS — S4991XA Unspecified injury of right shoulder and upper arm, initial encounter: Secondary | ICD-10-CM | POA: Diagnosis not present

## 2020-11-30 DIAGNOSIS — S63501A Unspecified sprain of right wrist, initial encounter: Secondary | ICD-10-CM | POA: Diagnosis not present

## 2020-11-30 DIAGNOSIS — S4991XA Unspecified injury of right shoulder and upper arm, initial encounter: Secondary | ICD-10-CM | POA: Diagnosis not present

## 2020-11-30 DIAGNOSIS — S335XXA Sprain of ligaments of lumbar spine, initial encounter: Secondary | ICD-10-CM | POA: Diagnosis not present

## 2020-12-05 DIAGNOSIS — S63501A Unspecified sprain of right wrist, initial encounter: Secondary | ICD-10-CM | POA: Diagnosis not present

## 2020-12-05 DIAGNOSIS — S335XXA Sprain of ligaments of lumbar spine, initial encounter: Secondary | ICD-10-CM | POA: Diagnosis not present

## 2020-12-05 DIAGNOSIS — S4991XA Unspecified injury of right shoulder and upper arm, initial encounter: Secondary | ICD-10-CM | POA: Diagnosis not present

## 2020-12-07 DIAGNOSIS — S4991XA Unspecified injury of right shoulder and upper arm, initial encounter: Secondary | ICD-10-CM | POA: Diagnosis not present

## 2020-12-07 DIAGNOSIS — S63501A Unspecified sprain of right wrist, initial encounter: Secondary | ICD-10-CM | POA: Diagnosis not present

## 2020-12-07 DIAGNOSIS — S335XXA Sprain of ligaments of lumbar spine, initial encounter: Secondary | ICD-10-CM | POA: Diagnosis not present

## 2021-01-04 DIAGNOSIS — F411 Generalized anxiety disorder: Secondary | ICD-10-CM | POA: Diagnosis not present

## 2021-01-16 DIAGNOSIS — S338XXD Sprain of other parts of lumbar spine and pelvis, subsequent encounter: Secondary | ICD-10-CM | POA: Diagnosis not present

## 2021-01-19 DIAGNOSIS — S338XXD Sprain of other parts of lumbar spine and pelvis, subsequent encounter: Secondary | ICD-10-CM | POA: Diagnosis not present

## 2021-02-05 DIAGNOSIS — F411 Generalized anxiety disorder: Secondary | ICD-10-CM | POA: Diagnosis not present

## 2021-02-05 DIAGNOSIS — G40909 Epilepsy, unspecified, not intractable, without status epilepticus: Secondary | ICD-10-CM | POA: Diagnosis not present

## 2021-02-08 DIAGNOSIS — M25531 Pain in right wrist: Secondary | ICD-10-CM | POA: Diagnosis not present

## 2021-02-08 DIAGNOSIS — M25511 Pain in right shoulder: Secondary | ICD-10-CM | POA: Diagnosis not present

## 2021-02-08 DIAGNOSIS — S338XXD Sprain of other parts of lumbar spine and pelvis, subsequent encounter: Secondary | ICD-10-CM | POA: Diagnosis not present

## 2021-02-08 DIAGNOSIS — M25521 Pain in right elbow: Secondary | ICD-10-CM | POA: Diagnosis not present

## 2021-02-13 DIAGNOSIS — S338XXD Sprain of other parts of lumbar spine and pelvis, subsequent encounter: Secondary | ICD-10-CM | POA: Diagnosis not present

## 2021-02-21 DIAGNOSIS — M25531 Pain in right wrist: Secondary | ICD-10-CM | POA: Diagnosis not present

## 2021-02-21 DIAGNOSIS — M25521 Pain in right elbow: Secondary | ICD-10-CM | POA: Diagnosis not present

## 2021-02-21 DIAGNOSIS — M25511 Pain in right shoulder: Secondary | ICD-10-CM | POA: Diagnosis not present

## 2021-02-21 DIAGNOSIS — S338XXD Sprain of other parts of lumbar spine and pelvis, subsequent encounter: Secondary | ICD-10-CM | POA: Diagnosis not present

## 2021-03-15 DIAGNOSIS — F411 Generalized anxiety disorder: Secondary | ICD-10-CM | POA: Diagnosis not present

## 2021-04-14 DIAGNOSIS — S51851A Open bite of right forearm, initial encounter: Secondary | ICD-10-CM | POA: Diagnosis not present

## 2021-04-14 DIAGNOSIS — W540XXA Bitten by dog, initial encounter: Secondary | ICD-10-CM | POA: Diagnosis not present

## 2021-04-14 DIAGNOSIS — Z23 Encounter for immunization: Secondary | ICD-10-CM | POA: Diagnosis not present

## 2021-04-26 DIAGNOSIS — F411 Generalized anxiety disorder: Secondary | ICD-10-CM | POA: Diagnosis not present

## 2021-04-26 DIAGNOSIS — S61409A Unspecified open wound of unspecified hand, initial encounter: Secondary | ICD-10-CM | POA: Diagnosis not present

## 2021-05-10 DIAGNOSIS — S0081XA Abrasion of other part of head, initial encounter: Secondary | ICD-10-CM | POA: Diagnosis not present

## 2021-06-23 DIAGNOSIS — R5383 Other fatigue: Secondary | ICD-10-CM | POA: Diagnosis not present

## 2021-06-23 DIAGNOSIS — Z20822 Contact with and (suspected) exposure to covid-19: Secondary | ICD-10-CM | POA: Diagnosis not present

## 2021-06-23 DIAGNOSIS — R0789 Other chest pain: Secondary | ICD-10-CM | POA: Diagnosis not present

## 2021-06-23 DIAGNOSIS — R42 Dizziness and giddiness: Secondary | ICD-10-CM | POA: Diagnosis not present

## 2021-06-23 DIAGNOSIS — R079 Chest pain, unspecified: Secondary | ICD-10-CM | POA: Diagnosis not present

## 2021-08-06 DIAGNOSIS — E785 Hyperlipidemia, unspecified: Secondary | ICD-10-CM | POA: Diagnosis not present

## 2021-08-06 DIAGNOSIS — J069 Acute upper respiratory infection, unspecified: Secondary | ICD-10-CM | POA: Diagnosis not present

## 2021-08-06 DIAGNOSIS — Z125 Encounter for screening for malignant neoplasm of prostate: Secondary | ICD-10-CM | POA: Diagnosis not present

## 2021-08-06 DIAGNOSIS — Z0000 Encounter for general adult medical examination without abnormal findings: Secondary | ICD-10-CM | POA: Diagnosis not present

## 2021-08-13 DIAGNOSIS — R7401 Elevation of levels of liver transaminase levels: Secondary | ICD-10-CM | POA: Diagnosis not present

## 2021-08-13 DIAGNOSIS — R799 Abnormal finding of blood chemistry, unspecified: Secondary | ICD-10-CM | POA: Diagnosis not present

## 2021-08-20 DIAGNOSIS — R799 Abnormal finding of blood chemistry, unspecified: Secondary | ICD-10-CM | POA: Diagnosis not present

## 2021-08-20 DIAGNOSIS — R7401 Elevation of levels of liver transaminase levels: Secondary | ICD-10-CM | POA: Diagnosis not present

## 2021-08-23 DIAGNOSIS — R7401 Elevation of levels of liver transaminase levels: Secondary | ICD-10-CM | POA: Diagnosis not present

## 2021-08-28 DIAGNOSIS — R7401 Elevation of levels of liver transaminase levels: Secondary | ICD-10-CM | POA: Diagnosis not present

## 2021-09-25 DIAGNOSIS — R7401 Elevation of levels of liver transaminase levels: Secondary | ICD-10-CM | POA: Diagnosis not present
